# Patient Record
Sex: Male | Born: 1960
Health system: Southern US, Community
[De-identification: ages and names within clinical notes are randomized; demographics above are authoritative.]

## PROBLEM LIST (undated history)

## (undated) DIAGNOSIS — F419 Anxiety disorder, unspecified: Secondary | ICD-10-CM

## (undated) DIAGNOSIS — K219 Gastro-esophageal reflux disease without esophagitis: Secondary | ICD-10-CM

## (undated) DIAGNOSIS — I10 Essential (primary) hypertension: Secondary | ICD-10-CM

## (undated) DIAGNOSIS — F32A Depression, unspecified: Secondary | ICD-10-CM

## (undated) HISTORY — DX: Depression, unspecified: F32.A

## (undated) HISTORY — DX: Essential (primary) hypertension: I10

## (undated) HISTORY — DX: Gastro-esophageal reflux disease without esophagitis: K21.9

## (undated) HISTORY — PX: TONSILLECTOMY: SUR1361

## (undated) HISTORY — DX: Anxiety disorder, unspecified: F41.9

---

## 1994-10-14 ENCOUNTER — Encounter (INDEPENDENT_AMBULATORY_CARE_PROVIDER_SITE_OTHER): Payer: Self-pay | Admitting: *Deleted

## 2003-08-10 ENCOUNTER — Ambulatory Visit (HOSPITAL_COMMUNITY): Admission: RE | Admit: 2003-08-10 | Discharge: 2003-08-10 | Payer: Self-pay | Admitting: Internal Medicine

## 2004-02-19 ENCOUNTER — Emergency Department (HOSPITAL_COMMUNITY): Admission: EM | Admit: 2004-02-19 | Discharge: 2004-02-19 | Payer: Self-pay | Admitting: Emergency Medicine

## 2004-02-22 ENCOUNTER — Ambulatory Visit (HOSPITAL_COMMUNITY): Admission: RE | Admit: 2004-02-22 | Discharge: 2004-02-22 | Payer: Self-pay | Admitting: Internal Medicine

## 2005-12-18 ENCOUNTER — Ambulatory Visit: Payer: Self-pay | Admitting: Internal Medicine

## 2008-07-31 ENCOUNTER — Ambulatory Visit: Payer: Self-pay | Admitting: Internal Medicine

## 2008-07-31 DIAGNOSIS — K219 Gastro-esophageal reflux disease without esophagitis: Secondary | ICD-10-CM | POA: Insufficient documentation

## 2008-07-31 DIAGNOSIS — N529 Male erectile dysfunction, unspecified: Secondary | ICD-10-CM | POA: Insufficient documentation

## 2008-07-31 DIAGNOSIS — R071 Chest pain on breathing: Secondary | ICD-10-CM | POA: Insufficient documentation

## 2008-08-01 DIAGNOSIS — E785 Hyperlipidemia, unspecified: Secondary | ICD-10-CM | POA: Insufficient documentation

## 2008-09-20 ENCOUNTER — Telehealth: Payer: Self-pay | Admitting: Internal Medicine

## 2008-11-01 ENCOUNTER — Ambulatory Visit: Payer: Self-pay | Admitting: Internal Medicine

## 2008-11-01 ENCOUNTER — Telehealth: Payer: Self-pay | Admitting: Internal Medicine

## 2009-08-06 ENCOUNTER — Ambulatory Visit: Payer: Self-pay | Admitting: Internal Medicine

## 2009-08-06 DIAGNOSIS — S86819A Strain of other muscle(s) and tendon(s) at lower leg level, unspecified leg, initial encounter: Secondary | ICD-10-CM

## 2009-08-06 DIAGNOSIS — S838X9A Sprain of other specified parts of unspecified knee, initial encounter: Secondary | ICD-10-CM | POA: Insufficient documentation

## 2009-08-07 ENCOUNTER — Encounter (INDEPENDENT_AMBULATORY_CARE_PROVIDER_SITE_OTHER): Payer: Self-pay | Admitting: *Deleted

## 2009-09-13 ENCOUNTER — Ambulatory Visit: Payer: Self-pay | Admitting: Internal Medicine

## 2009-09-13 LAB — CONVERTED CEMR LAB
ALT: 18 units/L (ref 0–53)
Albumin: 4.4 g/dL (ref 3.5–5.2)
Alkaline Phosphatase: 62 units/L (ref 39–117)
BUN: 14 mg/dL (ref 6–23)
Basophils Relative: 0.2 % (ref 0.0–3.0)
Bilirubin Urine: NEGATIVE
Calcium: 10 mg/dL (ref 8.4–10.5)
Eosinophils Absolute: 0.1 10*3/uL (ref 0.0–0.7)
GFR calc non Af Amer: 84.45 mL/min (ref >60)
Glucose, Bld: 113 mg/dL — ABNORMAL HIGH (ref 70–99)
Hemoglobin, Urine: NEGATIVE
Hemoglobin: 15.1 g/dL (ref 13.0–17.0)
Ketones, ur: NEGATIVE mg/dL
Lymphocytes Relative: 25 % (ref 12.0–46.0)
MCHC: 33.7 g/dL (ref 30.0–36.0)
MCV: 94.7 fL (ref 78.0–100.0)
Neutro Abs: 4 10*3/uL (ref 1.4–7.7)
Nitrite: NEGATIVE
RBC: 4.73 M/uL (ref 4.22–5.81)
Total Protein: 7.5 g/dL (ref 6.0–8.3)
Urine Glucose: NEGATIVE mg/dL

## 2009-09-16 ENCOUNTER — Ambulatory Visit: Payer: Self-pay | Admitting: Internal Medicine

## 2009-09-16 DIAGNOSIS — G4700 Insomnia, unspecified: Secondary | ICD-10-CM | POA: Insufficient documentation

## 2009-09-16 DIAGNOSIS — R5381 Other malaise: Secondary | ICD-10-CM

## 2009-09-16 DIAGNOSIS — R5383 Other fatigue: Secondary | ICD-10-CM | POA: Insufficient documentation

## 2009-09-16 DIAGNOSIS — R0602 Shortness of breath: Secondary | ICD-10-CM | POA: Insufficient documentation

## 2009-09-16 DIAGNOSIS — F341 Dysthymic disorder: Secondary | ICD-10-CM | POA: Insufficient documentation

## 2009-09-16 DIAGNOSIS — M654 Radial styloid tenosynovitis [de Quervain]: Secondary | ICD-10-CM | POA: Insufficient documentation

## 2009-09-16 DIAGNOSIS — Z87891 Personal history of nicotine dependence: Secondary | ICD-10-CM | POA: Insufficient documentation

## 2009-09-16 DIAGNOSIS — R1013 Epigastric pain: Secondary | ICD-10-CM | POA: Insufficient documentation

## 2009-09-16 DIAGNOSIS — R197 Diarrhea, unspecified: Secondary | ICD-10-CM | POA: Insufficient documentation

## 2009-09-17 LAB — CONVERTED CEMR LAB: Vit D, 25-Hydroxy: 27 ng/mL — ABNORMAL LOW (ref 30–89)

## 2009-09-18 ENCOUNTER — Ambulatory Visit: Payer: Self-pay | Admitting: Internal Medicine

## 2009-09-18 ENCOUNTER — Telehealth (INDEPENDENT_AMBULATORY_CARE_PROVIDER_SITE_OTHER): Payer: Self-pay | Admitting: *Deleted

## 2009-10-08 ENCOUNTER — Encounter: Admission: RE | Admit: 2009-10-08 | Discharge: 2009-10-08 | Payer: Self-pay | Admitting: Internal Medicine

## 2009-10-09 ENCOUNTER — Telehealth: Payer: Self-pay | Admitting: Internal Medicine

## 2009-10-17 ENCOUNTER — Ambulatory Visit: Payer: Self-pay | Admitting: Internal Medicine

## 2009-10-21 ENCOUNTER — Encounter: Payer: Self-pay | Admitting: Internal Medicine

## 2009-11-15 ENCOUNTER — Ambulatory Visit: Payer: Self-pay | Admitting: Internal Medicine

## 2009-11-15 DIAGNOSIS — R634 Abnormal weight loss: Secondary | ICD-10-CM | POA: Insufficient documentation

## 2009-11-15 DIAGNOSIS — E291 Testicular hypofunction: Secondary | ICD-10-CM | POA: Insufficient documentation

## 2009-11-15 LAB — CONVERTED CEMR LAB
FSH: 5.9 milliintl units/mL (ref 1.4–18.1)
LH: 2.55 milliintl units/mL (ref 1.50–9.30)

## 2010-01-20 ENCOUNTER — Ambulatory Visit: Payer: Self-pay | Admitting: Internal Medicine

## 2010-01-20 DIAGNOSIS — B079 Viral wart, unspecified: Secondary | ICD-10-CM | POA: Insufficient documentation

## 2010-01-20 DIAGNOSIS — E559 Vitamin D deficiency, unspecified: Secondary | ICD-10-CM | POA: Insufficient documentation

## 2010-01-27 ENCOUNTER — Encounter: Payer: Self-pay | Admitting: Internal Medicine

## 2010-01-27 ENCOUNTER — Ambulatory Visit: Payer: Self-pay

## 2010-01-27 ENCOUNTER — Encounter (HOSPITAL_COMMUNITY): Admission: RE | Admit: 2010-01-27 | Discharge: 2010-03-11 | Payer: Self-pay | Admitting: Internal Medicine

## 2010-01-27 ENCOUNTER — Ambulatory Visit: Payer: Self-pay | Admitting: Internal Medicine

## 2010-03-17 ENCOUNTER — Ambulatory Visit: Payer: Self-pay | Admitting: Internal Medicine

## 2010-03-18 ENCOUNTER — Ambulatory Visit: Payer: Self-pay | Admitting: Internal Medicine

## 2010-03-21 LAB — CONVERTED CEMR LAB: Testosterone: 243.95 ng/dL — ABNORMAL LOW (ref 350–890)

## 2010-04-21 ENCOUNTER — Encounter: Payer: Self-pay | Admitting: Internal Medicine

## 2010-07-20 LAB — CONVERTED CEMR LAB
ALT: 19 units/L (ref 0–53)
Basophils Absolute: 0.1 10*3/uL (ref 0.0–0.1)
Basophils Relative: 0.9 % (ref 0.0–3.0)
Blood Glucose, Fingerstick: 92
CO2: 30 meq/L (ref 19–32)
Calcium: 9.9 mg/dL (ref 8.4–10.5)
Cholesterol: 238 mg/dL (ref 0–200)
Creatinine, Ser: 1 mg/dL (ref 0.4–1.5)
Direct LDL: 151.8 mg/dL
Glucose, Bld: 95 mg/dL (ref 70–99)
HCT: 48.4 % (ref 39.0–52.0)
Hemoglobin: 16.6 g/dL (ref 13.0–17.0)
Ketones, ur: NEGATIVE mg/dL
Leukocytes, UA: NEGATIVE
Lymphocytes Relative: 15.8 % (ref 12.0–46.0)
MCHC: 34.2 g/dL (ref 30.0–36.0)
Monocytes Relative: 8.1 % (ref 3.0–12.0)
Neutro Abs: 6.9 10*3/uL (ref 1.4–7.7)
RBC: 5.25 M/uL (ref 4.22–5.81)
Specific Gravity, Urine: 1.02 (ref 1.000–1.03)
Total CHOL/HDL Ratio: 7.2
Total Protein: 8.1 g/dL (ref 6.0–8.3)
Vit D, 25-Hydroxy: 57 ng/mL (ref 30–89)
Vitamin B-12: 463 pg/mL (ref 211–911)
pH: 6 (ref 5.0–8.0)

## 2010-07-22 NOTE — Assessment & Plan Note (Signed)
Summary: Cardiology Nuclear Testing  Nuclear Med Background Indications for Stress Test: Evaluation for Ischemia    History Comments: NO DOCUMENTED CAD  Symptoms: DOE, Fatigue    Nuclear Pre-Procedure Cardiac Risk Factors: History of Smoking, Lipids Caffeine/Decaff Intake: None NPO After: 10:00 PM Lungs: Clear IV 0.9% NS with Angio Cath: 20g     IV Site: (R) AC IV Started by: Stanton Kidney EMT-P Chest Size (in) 40     Height (in): 70.5 Weight (lb): 171 BMI: 24.28  Nuclear Med Study 1 or 2 day study:  1 day     Stress Test Type:  Stress Reading MD:  Dietrich Pates, MD     Referring MD:  Sonda Primes, MD Resting Radionuclide:  Technetium 13m Tetrofosmin     Resting Radionuclide Dose:  10.8 mCi  Stress Radionuclide:  Technetium 74m Tetrofosmin     Stress Radionuclide Dose:  33.0 mCi   Stress Protocol Exercise Time (min):  15:00 min     Max HR:  157 bpm     Predicted Max HR:  171 bpm  Max Systolic BP: 237 mm Hg     Percent Max HR:  91.81 %     METS: 17.2 Rate Pressure Product:  [address]    Stress Test Technologist:  Rea College CMA-N     Nuclear Technologist:  Burna Mortimer Deal RT-N  Rest Procedure  Myocardial perfusion imaging was performed at rest 45 minutes following the intravenous administration of Myoview Technetium 76m Tetrofosmin.  Stress Procedure  The patient exercised for fifteen minutes.  The patient stopped due to fatigue and denied any chest pain.  There were no significant ST-T wave changes, only rare PVC's.  He did have a hypertensive response to exercise.  BP immediately post exercise was 237/117.  Myoview was injected at peak exercise and myocardial perfusion imaging was performed after a brief delay.  QPS Raw Data Images:  Soft tissue (diaphragm, bowel activity) underlie heart. Stress Images:  There is normal uptake in all areas. Rest Images:  Normal homogeneous uptake in all areas of the myocardium. Subtraction (SDS):  No evidence of ischemia. Transient Ischemic  Dilatation:  1.01  (Normal <1.22)  Lung/Heart Ratio:  .31  (Normal <0.45)  Quantitative Gated Spect Images QGS EDV:  130 ml QGS ESV:  54 ml QGS EF:  59 %   Overall Impression  Exercise Capacity: Excellent exercise capacity. BP Response: Hypertensive blood pressure response. Clinical Symptoms: No chest pain ECG Impression: No significant ST segment change suggestive of ischemia. Overall Impression: Normal stress nuclear study.

## 2010-07-22 NOTE — Letter (Signed)
Summary: [facility] Instructions  [facility]  [address] [place], [place] [address]   Phone: [phone]  Fax: [phone]       David Hinton    25-Mar-1961    MRN: [id number]        Procedure Day /Date:THURSDAY, 10/17/09     Arrival Time:2:30 PM     Procedure Time:3:30 PM     Location of Procedure:                    X  [facility] (4th Floor)                        PREPARATION FOR COLONOSCOPY WITH MOVIPREP   Starting 5 days prior to your procedure 4/23/11do not eat nuts, seeds, popcorn, corn, beans, peas,  salads, or any raw vegetables.  Do not take any fiber supplements (e.g. Metamucil, Citrucel, and Benefiber).  THE DAY BEFORE YOUR PROCEDURE         DATE: 10/16/09 DAY: WEDNESDAY  1.  Drink clear liquids the entire day-NO SOLID FOOD  2.  Do not drink anything colored red or purple.  Avoid juices with pulp.  No orange juice.  3.  Drink at least 64 oz. (8 glasses) of fluid/clear liquids during the day to prevent dehydration and help the prep work efficiently.  CLEAR LIQUIDS INCLUDE: Water Jello Ice Popsicles Tea (sugar ok, no milk/cream) Powdered fruit flavored drinks Coffee (sugar ok, no milk/cream) Gatorade Juice: apple, white grape, white cranberry  Lemonade Clear bullion, consomm, broth Carbonated beverages (any kind) Strained chicken noodle soup Hard Candy                             4.  In the morning, mix first dose of MoviPrep solution:    Empty 1 Pouch A and 1 Pouch B into the disposable container    Add lukewarm drinking water to the top line of the container. Mix to dissolve    Refrigerate (mixed solution should be used within 24 hrs)  5.  Begin drinking the prep at 5:00 p.m. The MoviPrep container is divided by 4 marks.   Every 15 minutes drink the solution down to the next mark (approximately 8 oz) until the full liter is complete.   6.  Follow completed prep with 16 oz of clear liquid of your choice (Nothing red  or purple).  Continue to drink clear liquids until bedtime.  7.  Before going to bed, mix second dose of MoviPrep solution:    Empty 1 Pouch A and 1 Pouch B into the disposable container    Add lukewarm drinking water to the top line of the container. Mix to dissolve    Refrigerate  THE DAY OF YOUR PROCEDURE      DATE: 10/17/09 DAY: THURSDAY  Beginning at 9:30 a.m. (5 hours before procedure):         1. Every 15 minutes, drink the solution down to the next mark (approx 8 oz) until the full liter is complete.  2. Follow completed prep with 16 oz. of clear liquid of your choice.    3. You may drink clear liquids until 12:30 PM (2 HOURS BEFORE PROCEDURE).   MEDICATION INSTRUCTIONS  Unless otherwise instructed, you should take regular prescription medications with a small sip of water   as early as possible the morning of your procedure.  OTHER INSTRUCTIONS  You will need a responsible adult at least 50 years of age to accompany you and drive you home.   This person must remain in the waiting room during your procedure.  Wear loose fitting clothing that is easily removed.  Leave jewelry and other valuables at home.  However, you may wish to bring a book to read or  an iPod/MP3 player to listen to music as you wait for your procedure to start.  Remove all body piercing jewelry and leave at home.  Total time from sign-in until discharge is approximately 2-3 hours.  You should go home directly after your procedure and rest.  You can resume normal activities the  day after your procedure.  The day of your procedure you should not:   Drive   Make legal decisions   Operate machinery   Drink alcohol   Return to work  You will receive specific instructions about eating, activities and medications before you leave.    The above instructions have been reviewed and explained to me by   _______________________    I fully understand and can verbalize these  instructions _____________________________ Date _________

## 2010-07-22 NOTE — Letter (Signed)
Summary: Patient Notice- Colon Biospy Results  [facility]  [address] [place], [place] [address]   Phone: [phone]  Fax: [phone]        Oct 21, 2009 MRN: [id number]    David Hinton [address] [place], [place]  [address]    Dear [name],  The biopsies taken during the colonoscopy both your ileum, distal small bowel, revealed inflammation or ileitis. This is not cancer. This can be seen in inflammatory conditions such as Crohn's.  The biopsies of your colon were normal. There was no evidence of microscopic colitis.  The biopsies of your proximal small bowel, and duodenum, taken during the upper endoscopy were normal. No evidence of sprue.  Additional information/recommendations:   __Please call [phone] to schedule a return visit to review      your condition, as already recommended.  __Continue with the treatment plan as outlined on the day of your      exam on your procedure reports.   Please call us if you are having persistent problems or have questions about your condition that have not been fully answered at this time.  Sincerely,  Hilarie Fredrickson MD   This letter has been electronically signed by your physician.  Appended Document: Patient Notice- Colon Biospy Results letter mailed 5.3.11

## 2010-07-22 NOTE — Procedures (Signed)
Summary: Panendoscopy/[facility]  Panendoscopy/[facility]   Imported By: Sherian Rein 09/20/2009 07:28:49  _____________________________________________________________________  External Attachment:    Type:   Image     Comment:   External Document

## 2010-07-22 NOTE — Assessment & Plan Note (Signed)
Summary: 6 WK ROV /NWS   Vital Signs:  Patient profile:   50 year old male Height:      71 inches Weight:      174 pounds BMI:     24.36 Temp:     98.9 degrees F oral Pulse rate:   88 / minute Pulse rhythm:   regular Resp:     16 per minute BP sitting:   122 / 90  (left arm) Cuff size:   regular  Vitals Entered By: Lanier Prude, CMA(AAMA) (January 20, 2010 1:33 PM)  Procedure Note  Wart Removal: The patient complains of irritation. Indication: warts  Procedure # 1: cryotherapy    Region: anterior    Location: L neck base    # lesions removed: 2    Technique: liquid N2    Comment: Tolerated well. Complicatons - none. Verbal consent.   CC: 6 wk f/u Is Patient Diabetic? No Comments pt is not taking Cialis, VIT D, Flexeril, Mobic, Wellbutrin or DHEA   Primary Care Provider:  Georgina Quint Grace Haggart MD  CC:  6 wk f/u.  History of Present Illness: The patient presents for a follow up of back pain, anxiety, depression. Stressed with mom's illness C/o skin lesions on L neck base  Current Medications (verified): 1)  Omeprazole 20 Mg Cpdr (Omeprazole) .... One By Mouth Daily 2)  Vitamin D3 1000 Unit  Tabs (Cholecalciferol) .Marland Kitchen.. 1 By Mouth Daily 3)  Cialis 10 Mg Tabs (Tadalafil) .Marland Kitchen.. 1 By Mouth Q1-3 D Prn 4)  Vitamin D 28413 Unit Caps (Ergocalciferol) .Marland Kitchen.. 1 Once Wk X 6 Wks Then Take Vit D 1000 International Units Otc Daily 5)  Flexeril 10 Mg Tabs (Cyclobenzaprine Hcl) .... 1/2-1 Tab By Mouth Every 8 Hours As Needed For Muscle Relaxant 6)  Mobic 15 Mg Tabs (Meloxicam) .Marland Kitchen.. 1 By Mouth Once Daily X7 Days, Then As Needed 7)  Wellbutrin Sr 150 Mg Xr12h-Tab (Bupropion Hcl) .Marland Kitchen.. 1 By Mouth Q Am and Q Lunch (Two Times A Day) 8)  Zolpidem Tartrate 10 Mg Tabs (Zolpidem Tartrate) .... 1/2-1 Tab At Bedtime As Needed Insomnia 9)  Dhea 25 Mg Tabs (Prasterone (Dhea)) .Marland Kitchen.. 1 By Mouth Qd  Allergies (verified): 1)  Viagra (Sildenafil Citrate) 2)  Levitra (Vardenafil Hcl)  Past  History:  Past Medical History: Last updated: 07/31/2008 GERD, erosive Dr [facility] ED Hyperlipidemia, mild  Past Surgical History: Last updated: 09/18/2009 Unremarkable  Family History: Last updated: 09/18/2009 No CA, no CAD No FH of Colon Cancer: Family History of Crohn's:Mother   Social History: Last updated: 09/18/2009 Occupation: Horse Veterinarian Single Former Smoker Alcohol use-yes Regular exercise-yes  Review of Systems       The patient complains of depression.  The patient denies fever, dyspnea on exertion, abdominal pain, and difficulty walking.         Stressed  Physical Exam  General:  Well-developed,well-nourished,in no acute distress; alert,appropriate and cooperative throughout examination Nose:  External nasal examination shows no deformity or inflammation. Nasal mucosa are pink and moist without lesions or exudates. Mouth:  Oral mucosa and oropharynx without lesions or exudates.  Teeth in good repair. Neck:  No deformities, masses, or tenderness noted. Lungs:  Normal respiratory effort, chest expands symmetrically. Lungs are clear to auscultation, no crackles or wheezes. Heart:  Normal rate and regular rhythm. S1 and S2 normal without gallop, murmur, click, rub or other extra sounds. Abdomen:  Bowel sounds positive,abdomen soft and non-tender without masses, organomegaly or hernias noted. Msk:  No deformity or scoliosis noted of thoracic or lumbar spine.   Extremities:  No clubbing, cyanosis, edema, or deformity noted with normal full range of motion of all joints.   Neurologic:  No cranial nerve deficits noted. Station and gait are normal. Plantar reflexes are down-going bilaterally. DTRs are symmetrical throughout. Sensory, motor and coordinative functions appear intact. Skin:  Two wart-like lesions at L neck base Psych:  Cognition and judgment appear intact. Alert and cooperative with normal attention span and concentration. No apparent delusions,  illusions, hallucinationsOriented X3, normally interactive, good eye contact, not suicidal, and slightly anxious.  not homicidal.     Impression & Recommendations:  Problem # 1:  DEPRESSION/ANXIETY (ICD-300.4) Assessment Unchanged Discussed. He has not started Wellbutrin yet. I asked him to start  Problem # 2:  HYPOGONADISM (ICD-257.2) Assessment: New He has not started DHEA. We discussed replacement options again. Info provided. We will geet an Endocr consult. Orders: Endocrinology Referral (Endocrine) Dr Lucianne Muss. The labs were reviewed with the patient. LH, FSH - nl.  Problem # 3:  FATIGUE (ICD-780.79) Assessment: Unchanged As per #1,2 Orders: Endocrinology Referral (Endocrine)  Problem # 4:  ERECTILE DYSFUNCTION (ZOX-096.04) Assessment: Unchanged  His updated medication list for this problem includes:    Cialis 10 Mg Tabs (Tadalafil) .Marland Kitchen... 1 by mouth q1-3 d prn  Problem # 5:  ABDOMINAL PAIN, EPIGASTRIC (ICD-789.06) Assessment: Improved  Orders: Cardiolite (Cardiolite)  Problem # 6:  DYSPNEA (ICD-786.05) Assessment: Unchanged  Orders: Cardiolite (Cardiolite)  Problem # 7:  VITAMIN D DEFICIENCY (ICD-268.9) Assessment: New He has not started Rx yet. Risks of noncompliance with treatment discussed. Compliance encouraged.   Complete Medication List: 1)  Vitamin D3 1000 Unit Tabs (Cholecalciferol) .Marland Kitchen.. 1 by mouth daily 2)  Cialis 10 Mg Tabs (Tadalafil) .Marland Kitchen.. 1 by mouth q1-3 d prn 3)  Vitamin D 54098 Unit Caps (Ergocalciferol) .Marland Kitchen.. 1 once wk x 6 wks then take vit d 1000 international units otc daily 4)  Flexeril 10 Mg Tabs (Cyclobenzaprine hcl) .... 1/2-1 tab by mouth every 8 hours as needed for muscle relaxant 5)  Mobic 15 Mg Tabs (Meloxicam) .Marland Kitchen.. 1 by mouth once daily x7 days, then as needed 6)  Wellbutrin Sr 150 Mg Xr12h-tab (Bupropion hcl) .Marland Kitchen.. 1 by mouth q am and q lunch (two times a day) 7)  Zolpidem Tartrate 10 Mg Tabs (Zolpidem tartrate) .... 1/2-1 tab at bedtime  as needed insomnia 8)  Dhea 25 Mg Tabs (Prasterone (dhea)) .Marland Kitchen.. 1 by mouth qd 9)  Omeprazole 40 Mg Cpdr (Omeprazole) .Marland Kitchen.. 1 by mouth qam for indigestion 10)  Penlac 8 % Soln (Ciclopirox) .... Use once daily on affected nail(s). once a week remove the build-up with an alcohol swab  Other Orders: Wart Destruct <14 ([address])  Patient Instructions: 1)  Please schedule a follow-up appointment in 6  weeks. 2)  DHEA and testost 995.20 3)  Vit D 268.9  Prescriptions: PENLAC 8 % SOLN (CICLOPIROX) Use once daily on affected nail(s). Once a week remove the build-up with an alcohol swab  #1 x 2   Entered and Authorized by:   Tresa Garter MD   Signed by:   Tresa Garter MD on 01/20/2010   Method used:   Print then Give to Patient   RxID:   [id number] OMEPRAZOLE 40 MG CPDR (OMEPRAZOLE) 1 by mouth qam for indigestion  #90 x 3   Entered and Authorized by:   Tresa Garter MD   Signed by:   Macarthur Critchley  V Hilde Churchman MD on 01/20/2010   Method used:   Print then Give to Patient   RxID:   [id number]

## 2010-07-22 NOTE — Procedures (Signed)
Summary: Upper Endoscopy  Patient: [name] Note: All result statuses are Final unless otherwise noted.  Tests: (1) Upper Endoscopy (EGD)   EGD Upper Endoscopy       DONE     [facility]     [address]     [place], [place]  [address]           ENDOSCOPY PROCEDURE REPORT           PATIENT:  David Hinton, David Hinton  MR#:  [id number]     BIRTHDATE:  [birth year], 48 yrs. old  GENDER:  male           ENDOSCOPIST:  Wilhemina Bonito. Eda Keys, MD     Referred by:  Office           PROCEDURE DATE:  10/17/2009     PROCEDURE:  EGD with biopsies     ASA CLASS:  Class II     INDICATIONS:  abdominal pain, GERD, diarrhea           MEDICATIONS:   There was residual sedation effect present from     prior procedure.     TOPICAL ANESTHETIC:  Exactacain Spray           DESCRIPTION OF PROCEDURE:   After the risks benefits and     alternatives of the procedure were thoroughly explained, informed     consent was obtained.  The LB [id number]-[id number] endoscope was     introduced through the mouth and advanced to the second portion of     the duodenum, without limitations.  The instrument was slowly     withdrawn as the mucosa was fully examined.     <<PROCEDUREIMAGES>>           A benign 16mm ring-like stricture was found in the distal     esophagus.  The upper, middle, and distal third of the esophagus     were carefully inspected and no additional abnormalities were     noted. The z-line was well seen at the GEJ. The endoscope was     pushed into the fundus which was normal including a retroflexed     view. The antrum,gastric body, first and second part of the     duodenum were unremarkable. Biopsies of the postbulbar duodenum     were taken to r/o sprue.   Retroflexed views revealed a hiatal     hernia.    The scope was then withdrawn from the patient and the     procedure completed.           COMPLICATIONS:  None           ENDOSCOPIC IMPRESSION:     1) Benign Stricture in the distal  esophagus     2) Hiatal hernia     2) Normal EGD otherwise     3) GERD           RECOMMENDATIONS:     1) continue PPI (omeprazole daily)     2) Anti-reflux regimen to be followed     3) OP follow-up in a few weeks to review findings and biosy     results           ______________________________     Wilhemina Bonito. Eda Keys, MD           CC:  Linda Hedges. Plotnikov, MD, The Patient           n.     eSIGNED:  Wilhemina Bonito. Eda Keys at 10/17/2009 04:54 PM           Lynnae January, [id number]  Note: An exclamation mark (!) indicates a result that was not dispersed into the flowsheet. Document Creation Date: 10/17/2009 4:55 PM _______________________________________________________________________  (1) Order result status: Final Collection or observation date-time: 10/17/2009 16:45 Requested date-time:  Receipt date-time:  Reported date-time:  Referring Physician:   Ordering Physician: Fransico Setters [phone]) Specimen Source:  Source: Launa Grill Order Number: [id number] Lab site:
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
## 2010-07-22 NOTE — Assessment & Plan Note (Signed)
Summary: 2 mth fu  stc   Vital Signs:  Patient profile:   50 year old male Height:      71 inches Weight:      169 pounds BMI:     23.66 O2 Sat:      98 % on Room air Temp:     97.0 degrees F oral Pulse rate:   58 / minute Pulse rhythm:   regular BP sitting:   130 / 88  (left arm) Cuff size:   large  Vitals Entered By: Rock Nephew CMA (Nov 15, 2009 10:21 AM)  O2 Flow:  Room air  Primary Care Provider:  Tresa Garter MD   History of Present Illness: The patient presents for a follow up of back pain, anxiety, depression and headaches.   Allergies: 1)  Viagra (Sildenafil Citrate) 2)  Levitra (Vardenafil Hcl)  Physical Exam  General:  Well-developed,well-nourished,in no acute distress; alert,appropriate and cooperative throughout examination Nose:  External nasal examination shows no deformity or inflammation. Nasal mucosa are pink and moist without lesions or exudates. Mouth:  Oral mucosa and oropharynx without lesions or exudates.  Teeth in good repair. Lungs:  Normal respiratory effort, chest expands symmetrically. Lungs are clear to auscultation, no crackles or wheezes. Heart:  Normal rate and regular rhythm. S1 and S2 normal without gallop, murmur, click, rub or other extra sounds. Abdomen:  Bowel sounds positive,abdomen soft and non-tender without masses, organomegaly or hernias noted. Msk:  No deformity or scoliosis noted of thoracic or lumbar spine.   Extremities:  No clubbing, cyanosis, edema, or deformity noted with normal full range of motion of all joints.   Neurologic:  No cranial nerve deficits noted. Station and gait are normal. Plantar reflexes are down-going bilaterally. DTRs are symmetrical throughout. Sensory, motor and coordinative functions appear intact. Skin:  Intact without suspicious lesions or rashes Psych:  Cognition and judgment appear intact. Alert and cooperative with normal attention span and concentration. No apparent delusions,  illusions, hallucinationsOriented X3, normally interactive, good eye contact, not suicidal, and slightly anxious.  not homicidal.     Impression & Recommendations:  Problem # 1:  WEIGHT LOSS (ICD-783.21) Assessment Comment Only  Orders: T-Dehydroepiandrosterone (DHEA) (04540-98119) TLB-Luteinizing Hormone (LH) (83002-LH) TLB-FSH (Follicle Stimulating Hormone) (83001-FSH)  Problem # 2:  HYPOGONADISM (ICD-257.2) Assessment: Comment Only  Treat stress/depr. Will start Androgel if DHEA did not help Try DHEA Get LH, FSH, DHEA  Orders: T-Dehydroepiandrosterone (DHEA) (14782-95621) TLB-Luteinizing Hormone (LH) (83002-LH) TLB-FSH (Follicle Stimulating Hormone) (83001-FSH)  Problem # 3:  FATIGUE (ICD-780.79) Assessment: Comment Only  Problem # 4:  DEPRESSION/ANXIETY (ICD-300.4) Assessment: Comment Only Start Wellbutrin  Risks of noncompliance with treatment discussed. Compliance encouraged.   Problem # 5:  DYSPNEA (ICD-786.05) ? etiol. Assessment: Unchanged CL is pending   Complete Medication List: 1)  Omeprazole 20 Mg Cpdr (Omeprazole) .... One by mouth daily 2)  Vitamin D3 1000 Unit Tabs (Cholecalciferol) .Marland Kitchen.. 1 by mouth daily 3)  Cialis 10 Mg Tabs (Tadalafil) .Marland Kitchen.. 1 by mouth q1-3 d prn 4)  Vitamin D 30865 Unit Caps (Ergocalciferol) .Marland Kitchen.. 1 once wk x 6 wks then take vit d 1000 international units otc daily 5)  Flexeril 10 Mg Tabs (Cyclobenzaprine hcl) .... 1/2-1 tab by mouth every 8 hours as needed for muscle relaxant 6)  Mobic 15 Mg Tabs (Meloxicam) .Marland Kitchen.. 1 by mouth once daily x7 days, then as needed 7)  Wellbutrin Sr 150 Mg Xr12h-tab (Bupropion hcl) .Marland Kitchen.. 1 by mouth q am and q lunch (  two times a day) 8)  Zolpidem Tartrate 10 Mg Tabs (Zolpidem tartrate) .... 1/2-1 tab at bedtime as needed insomnia 9)  Dhea 25 Mg Tabs (Prasterone (dhea)) .Marland Kitchen.. 1 by mouth qd  Patient Instructions: 1)  Start Wellbutrin and DHEA 2)  Please schedule a follow-up appointment in 6 weeks with  testosterone check prior 780.79. Prescriptions: DHEA 25 MG TABS (PRASTERONE (DHEA)) 1 by mouth qd  #30 x 6   Entered and Authorized by:   Tresa Garter MD   Signed by:   Tresa Garter MD on 11/15/2009   Method used:   Print then Give to Patient   RxID:   [id number]

## 2010-07-22 NOTE — Progress Notes (Signed)
Summary: Omeprazole refill  Phone Note Refill Request Message from:  Fax from Pharmacy on October 09, 2009 8:26 AM  Refills Requested: Medication #1:  OMEPRAZOLE 20 MG CPDR one by mouth daily Initial call taken by: Lucious Groves,  October 09, 2009 8:26 AM    Prescriptions: OMEPRAZOLE 20 MG CPDR (OMEPRAZOLE) one by mouth daily  #30 x 12   Entered by:   Lucious Groves   Authorized by:   Tresa Garter MD   Signed by:   Lucious Groves on 10/09/2009   Method used:   Electronically to        [facility]. [phone]* (retail)       [address]       [place], [place]  [address]       Ph: [phone]       Fax: [phone]   RxID:   [id number]

## 2010-07-22 NOTE — Assessment & Plan Note (Signed)
Summary: GERD/ diarrhea / pain    History of Present Illness Visit Type: consult  Primary GI MD: Yancey Flemings MD Primary Provider: Tresa Garter MD Requesting Provider: Rene Paci, MD  Chief Complaint: GERD, Nausea and Vomiting  History of Present Illness:   50 year old white male with anxiety, gastroesophageal reflux disease complicated by erosive esophagitis, and probable irritable bowel syndrome. He underwent upper endoscopy in 1996 for GI bleeding and was found to have severe erosive esophagitis. He was seen in this office in May of 2002 for reflux disease, lower abdominal complaints, and Hemoccult-positive stool. It was recommended that he undergo colonoscopy and upper endoscopy. He failed to follow through. He has not been seen since. He presents today with a multitude of complaints. He reports significant epigastric discomfort and heartburn if he does not take omeprazole 20 mg daily. On medication symptoms seem fairly well controlled. He does have occasional nausea with vomiting. No GI bleeding. He's had some weight loss. He continues with postprandial urgency associated with loose stools. He tells that his mother was diagnosed with Crohn's disease recently. He seems anxious and worried. He also exhibits peculiar behavior. Specifically, he reports eating meals out of dumpsters behind grocery stores. He tells me that I need to "vet him".   GI Review of Systems    Reports abdominal pain, acid reflux, bloating, heartburn, nausea, and  vomiting.     Location of  Abdominal pain: upper abdomen.    Denies belching, chest pain, dysphagia with liquids, dysphagia with solids, loss of appetite, vomiting blood, weight loss, and  weight gain.      Reports diarrhea.     Denies anal fissure, black tarry stools, change in bowel habit, constipation, diverticulosis, fecal incontinence, heme positive stool, hemorrhoids, irritable bowel syndrome, jaundice, light color stool, liver problems, rectal  bleeding, and  rectal pain.    Current Medications (verified): 1)  Omeprazole 20 Mg Cpdr (Omeprazole) .... One By Mouth Daily 2)  Vitamin D3 1000 Unit  Tabs (Cholecalciferol) .Marland Kitchen.. 1 By Mouth Daily 3)  Cialis 10 Mg Tabs (Tadalafil) .Marland Kitchen.. 1 By Mouth Q1-3 D Prn 4)  Vitamin D 16109 Unit Caps (Ergocalciferol) .Marland Kitchen.. 1 Once Wk X 6 Wks Then Take Vit D 1000 International Units Otc Daily 5)  Flexeril 10 Mg Tabs (Cyclobenzaprine Hcl) .... 1/2-1 Tab By Mouth Every 8 Hours As Needed For Muscle Relaxant 6)  Mobic 15 Mg Tabs (Meloxicam) .Marland Kitchen.. 1 By Mouth Once Daily X7 Days, Then As Needed 7)  Wellbutrin Sr 150 Mg Xr12h-Tab (Bupropion Hcl) .Marland Kitchen.. 1 By Mouth Q Am and Q Lunch (Two Times A Day) 8)  Zolpidem Tartrate 10 Mg Tabs (Zolpidem Tartrate) .... 1/2-1 Tab At Bedtime As Needed Insomnia  Allergies (verified): 1)  Viagra (Sildenafil Citrate) 2)  Levitra (Vardenafil Hcl)  Past History:  Past Medical History: Reviewed history from 07/31/2008 and no changes required. GERD, erosive Dr [facility] Hyperlipidemia, mild  Past Surgical History: Unremarkable  Family History: No CA, no CAD No FH of Colon Cancer: Family History of Crohn's:Mother   Social History: Reviewed history from 09/16/2009 and no changes required. 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  The patient denies allergy/sinus, anemia, anxiety-new, arthritis/joint pain, blood in urine, breast changes/lumps, confusion, cough, coughing up blood, fainting, fever, hearing problems, heart murmur, heart rhythm changes, itching, muscle  pains/cramps, night sweats, nosebleeds, shortness of breath, skin rash, sore throat, swelling of feet/legs, swollen lymph glands, thirst - excessive, urination - excessive, urination changes/pain, urine leakage, and  voice change.    Vital Signs:  Patient profile:   50 year old male Height:      71 inches Weight:      176 pounds BMI:     24.64 BSA:     2.00 Pulse rate:   80 / minute Pulse rhythm:   regular BP sitting:   152 / 102  (left arm) Cuff size:   regular  Vitals Entered By: Ok Anis CMA (September 18, 2009 2:00 PM)  Physical Exam  General:  Well developed, well nourished, no acute distress. Head:  Normocephalic and atraumatic. Eyes:  PERRLA, no icterus. Ears:  Normal auditory acuity. Nose:  No deformity, discharge,  or lesions. Mouth:  No deformity or lesions, dentition normal. Neck:  Supple; no masses or thyromegaly. Lungs:  Clear throughout to auscultation. Heart:  Regular rate and rhythm; no murmurs, rubs,  or bruits. Abdomen:  Soft, nontender and nondistended. No masses, hepatosplenomegaly or hernias noted. Normal bowel sounds. Rectal:  deferred until colonoscopy Prostate:  deferred until colonoscopy Msk:  Symmetrical with no gross deformities. Normal posture. Pulses:  Normal pulses noted. Extremities:  No clubbing, cyanosis, edema or deformities noted. Neurologic:  Alert and  oriented x4;  grossly normal neurologically. Skin:  Intact without significant lesions or rashes. Cervical Nodes:  No significant cervical adenopathy.no supraclavicular adenopathy Psych:  Alert and cooperative. Normal mood. Somewhat odd affect   Impression & Recommendations:  Problem # 1:  GERD (ICD-530.81) GERD with a history of severe erosive esophagitis. He seems to have intermittent problems with nausea and vomiting (787.01) and epigastric discomfort (789.06) when off PPI therapy. Cannot rule out ulcer disease.  Plan: #1. Omeprazole 20 mg daily #2. Reflux precautions #3. Discussion today on reflux disease  Problem # 2:  DIARRHEA, CHRONIC (ICD-787.91) chronic postprandial loose stools with urgency. Most consistent with irritable bowel. He has had this for years. Mother with inflammatory bowel  disease. Patient near age 45.  Plan #1. Colonoscopy to evaluate diarrhea and provide colorectal neoplasia screening. The nature of the procedure as well as risks, benefits, and alternatives have been reviewed. He understood and agreed to proceed. #2. Movi prep prescribed. The patient instructed on it use  Patient Instructions: 1)  Colon/Endo [facility] 10/17/09 2:30 pm arrive at 1:30 pm 2)  Movi prep instructions given to patient. 3)  Movi prep Rx. sent to your pharmacy for pick up. 4)  Colonoscopy and Flexible Sigmoidoscopy brochure given.  5)  Upper Endoscopy brochure given.  6)  Copy Sent to:  Dr. Trinna Post Plotnikov 7)  The medication list was reviewed and reconciled.  All changed / newly prescribed medications were explained.  A complete medication list was provided to the patient / caregiver. 8)  printed and given to patient. Milford Cage [facility]  September 18, 2009 2:54 PM 9)  Copy: Dr. Sonda Primes Prescriptions: MOVIPREP 100 GM  SOLR (PEG-KCL-NACL-NASULF-NA ASC-C) As per prep instructions.  #1 x 0   Entered by:   Milford Cage NCMA   Authorized by:   Hilarie Fredrickson MD   Signed by:   Milford Cage NCMA on 09/18/2009   Method used:   Electronically to        [facility]. [phone]* (retail)       [address]       [place]  [place], [place]  [address]       Ph: [phone]       Fax: [phone]   RxID:   [id number]   Appended Document: GERD/ diarrhea / pain ADDENDUM. Review of outside laboratories shows normal CBC, comprehensive metabolic panel, and TSH from 2 weeks ago

## 2010-07-22 NOTE — Procedures (Signed)
Summary: Colonoscopy  Patient: David Hinton Note: All result statuses are Final unless otherwise noted.  Tests: (1) Colonoscopy (COL)   COL Colonoscopy           DONE     [facility]     [address]     [place], [place]  [address]           COLONOSCOPY PROCEDURE REPORT           PATIENT:  David Hinton, David Hinton  MR#:  [id number]     BIRTHDATE:  [birth year], 48 yrs. old  GENDER:  male     ENDOSCOPIST:  Wilhemina Bonito. Eda Keys, MD     REF. BY:  Office     PROCEDURE DATE:  10/17/2009     PROCEDURE:  Colonoscopy with biopsies     ASA CLASS:  Class II     INDICATIONS:  unexplained diarrhea ; FAMILY HX IBD     MEDICATIONS:   Fentanyl 75 mcg IV, Versed 8 mg IV, Benadryl 25 mg     IV           DESCRIPTION OF PROCEDURE:   After the risks benefits and     alternatives of the procedure were thoroughly explained, informed     consent was obtained.  Digital rectal exam was performed and     revealed no abnormalities.   The LB [id number] endoscope     was introduced through the anus and advanced to the cecum, which     was identified by both the appendix and ileocecal valve, without     limitations.Time to cecum = 2:03 min. The quality of the prep was     excellent, using MoviPrep.  The instrument was then slowly     withdrawn (time = 13:10 min) as the colon was fully examined.     <<PROCEDUREIMAGES>>           FINDINGS:  A normal appearing cecum, ileocecal valve, and     appendiceal orifice were identified. The ascending, hepatic     flexure, transverse, splenic flexure, descending, sigmoid colon,     and rectum appeared unremarkable.Random colon bx taken to r/o     microscopic colitis.  There were inflammatory changes in the     terminal ileum as manifested by nonspecific punctate ulcers.     Retroflexed views in the rectum revealed no abnormalities.    The     scope was then withdrawn from the patient and the procedure     completed.           COMPLICATIONS:  None  ENDOSCOPIC IMPRESSION:     1) Normal colon - s/p biopsies     2) Ileitis in the terminal ileum - s/p biopsies     3) No polyps or  cancer           RECOMMENDATIONS:     1) Await biopsy results     2) Continue current colorectal screening recommendations for     "routine risk" patients with a repeat colonoscopy in 10 years.     3) EGD today           _____________________________     Wilhemina Bonito. Eda Keys, MD           CC:  Linda Hedges. Plotnikov, MD; The Patient           n.     eSIGNED:   Wilhemina Bonito. Eda Keys at 10/17/2009 04:39 PM  David Hinton, David Hinton, [id number]  Note: An exclamation mark (!) indicates a result that was not dispersed into the flowsheet. Document Creation Date: 10/17/2009 4:40 PM _______________________________________________________________________  (1) Order result status: Final Collection or observation date-time: 10/17/2009 16:25 Requested date-time:  Receipt date-time:  Reported date-time:  Referring Physician:   Ordering Physician: Fransico Setters [id number]) Specimen Source:  Source: Launa Grill Order Number: [id number] Lab site:   Appended Document: Colonoscopy recall in 10 yrs/07-2019/aw     Procedures Next Due Date:    Colonoscopy: 09/2019

## 2010-07-22 NOTE — Assessment & Plan Note (Signed)
Summary: 6 WK ROV /NWS  #   Vital Signs:  Patient profile:   50 year old male Height:      70.5 inches Weight:      172 pounds BMI:     24.42 Temp:     98.4 degrees F oral Pulse rate:   80 / minute Pulse rhythm:   regular Resp:     16 per minute BP sitting:   130 / 88  (left arm) Cuff size:   regular  Vitals Entered By: Lanier Prude, CMA(AAMA) (March 18, 2010 2:09 PM) CC: 6 wk f/u Is Patient Diabetic? No   Primary Care Provider:  Tresa Garter MD  CC:  6 wk f/u.  History of Present Illness: The patient presents for a follow up of  anxiety, depression and GERD   Current Medications (verified): 1)  Vitamin D3 1000 Unit  Tabs (Cholecalciferol) .Marland Kitchen.. 1 By Mouth Daily 2)  Cialis 10 Mg Tabs (Tadalafil) .Marland Kitchen.. 1 By Mouth Q1-3 D Prn 3)  Vitamin D 54098 Unit Caps (Ergocalciferol) .Marland Kitchen.. 1 Once Wk X 6 Wks Then Take Vit D 1000 International Units Otc Daily 4)  Flexeril 10 Mg Tabs (Cyclobenzaprine Hcl) .... 1/2-1 Tab By Mouth Every 8 Hours As Needed For Muscle Relaxant 5)  Mobic 15 Mg Tabs (Meloxicam) .Marland Kitchen.. 1 By Mouth Once Daily X7 Days, Then As Needed 6)  Wellbutrin Sr 150 Mg Xr12h-Tab (Bupropion Hcl) .Marland Kitchen.. 1 By Mouth Q Am and Q Lunch (Two Times A Day) 7)  Zolpidem Tartrate 10 Mg Tabs (Zolpidem Tartrate) .... 1/2-1 Tab At Bedtime As Needed Insomnia 8)  Dhea 25 Mg Tabs (Prasterone (Dhea)) .Marland Kitchen.. 1 By Mouth Qd 9)  Omeprazole 40 Mg Cpdr (Omeprazole) .Marland Kitchen.. 1 By Mouth Qam For Indigestion 10)  Penlac 8 % Soln (Ciclopirox) .... Use Once Daily On Affected Nail(S). 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    Physical Exam  General:  Well-developed,well-nourished,in no acute distress; alert,appropriate and cooperative throughout examination Mouth:  Oral mucosa and oropharynx without lesions or exudates.  Teeth in good repair. Lungs:  Normal respiratory effort, chest expands symmetrically. Lungs are clear to auscultation, no crackles or wheezes. Heart:  Normal rate and regular rhythm. S1 and S2 normal without gallop, murmur, click, rub or other extra sounds. Abdomen:  Bowel sounds positive,abdomen soft and non-tender without masses, organomegaly or hernias noted. Msk:  No deformity or scoliosis noted of thoracic or lumbar spine.   Neurologic:  No cranial nerve deficits noted. Station and gait are normal. Plantar reflexes are down-going bilaterally. DTRs are symmetrical throughout. Sensory, motor and coordinative functions appear intact. Skin:  Two wart-like lesions at L neck base Psych:  Cognition and judgment appear intact. Alert and cooperative with normal attention span and concentration. No apparent delusions, illusions, hallucinationsOriented X3, normally interactive, good eye contact, not suicidal, and slightly anxious.  not homicidal.     Impression & Recommendations:  Problem # 1:  HYPOGONADISM (ICD-257.2) Assessment Unchanged We discussed again treatment options  Problem # 2:  FATIGUE (ICD-780.79) Assessment: Unchanged  Problem # 3:  WEIGHT LOSS (ICD-783.21) Assessment: Improved  Problem # 4:  DEPRESSION/ANXIETY (ICD-300.4) Assessment: Improved  Complete Medication List: 1)  Vitamin D3 1000 Unit Tabs (Cholecalciferol) .Marland Kitchen.. 1 by mouth daily 2)  Cialis 10 Mg Tabs (Tadalafil) .Marland Kitchen.. 1 by mouth q1-3 d prn 3)  Vitamin D 56213 Unit Caps (Ergocalciferol) .Marland Kitchen.. 1 once wk x 6 wks then take vit d 1000 international units otc daily 4)  Flexeril 10 Mg Tabs (Cyclobenzaprine hcl) .... 1/2-1 tab by mouth every 8 hours as  needed for muscle relaxant 5)  Mobic 15 Mg Tabs (Meloxicam) .Marland Kitchen.. 1 by mouth once daily x7 days, then as needed 6)  Wellbutrin Sr 150 Mg Xr12h-tab (Bupropion hcl) .Marland Kitchen.. 1 by mouth q am and q lunch (two times a day) 7)  Zolpidem Tartrate 10 Mg Tabs (Zolpidem tartrate) .... 1/2-1 tab at bedtime as needed insomnia 8)  Dhea 25 Mg Tabs (Prasterone (dhea)) .Marland Kitchen.. 1 by mouth qd 9)  Omeprazole 40 Mg Cpdr (Omeprazole) .Marland Kitchen.. 1 by mouth qam for indigestion 10)  Penlac 8 % Soln (Ciclopirox) .... Use once daily on affected nail(s). once a week remove the build-up with an alcohol swab  Contraindications/Deferment of Procedures/Staging:    Test/Procedure: FLU VAX    Reason for deferment: patient declined   Patient Instructions: 1)  Please schedule a follow-up appointment in 3 months. 2)  BMP prior to visit, ICD-9: 3)  Hepatic Panel prior to visit, ICD-9: 4)  testosterone 257.2 Prescriptions: PENLAC 8 % SOLN (CICLOPIROX) Use once daily on affected nail(s). Once a week remove the build-up with an alcohol swab  #1 x 2   Entered and Authorized by:   Tresa Garter MD   Signed by:   Tresa Garter MD on 03/18/2010   Method used:   Print then Give to Patient   RxID:   [id number] OMEPRAZOLE 40 MG CPDR (OMEPRAZOLE) 1 by mouth qam for indigestion  #90 x 3   Entered and Authorized by:   Tresa Garter MD   Signed by:   Tresa Garter MD on 03/18/2010   Method used:   Print then Give to Patient   RxID:   [id number] WELLBUTRIN SR 150 MG XR12H-TAB (BUPROPION HCL) 1 by mouth q am and q lunch (two times a day)  #180 x 1   Entered and Authorized by:   Tresa Garter MD   Signed by:   Tresa Garter MD on 03/18/2010   Method used:   Print then Give to Patient   RxID:   [id number] CIALIS 10 MG TABS (TADALAFIL) 1 by mouth q1-3 d prn  #12 x 12   Entered and Authorized by:   Tresa Garter MD   Signed by:   Tresa Garter MD on 03/18/2010   Method used:   Print  then Give to Patient   RxID:   [id number]    Not Administered:    Influenza Vaccine not given due to: patient condition

## 2010-07-22 NOTE — Assessment & Plan Note (Signed)
Summary: MAY PHYSICAL--PER PT MAR D/T--STC   Vital Signs:  Patient profile:   50 year old male Height:      71 inches Weight:      175 pounds BMI:     24.50 Temp:     98.4 degrees F oral Pulse rate:   78 / minute BP sitting:   142 / 82  (left arm)  Vitals Entered By: Tora Perches (September 16, 2009 11:08 AM) CC: cpx Is Patient Diabetic? No   Primary Care Provider:  Tresa Garter MD  CC:  cpx.  History of Present Illness: The patient presents for a wellness examination  C/o fatigue, insomnia; depressed, anxious C/o occasional diarrhea  Preventive Screening-Counseling & Management  Alcohol-Tobacco     Smoking Status: quit  Caffeine-Diet-Exercise     Does Patient Exercise: yes  Current Medications (verified): 1)  Omeprazole 20 Mg Cpdr (Omeprazole) .... Two By Mouth Daily 2)  Vitamin D3 1000 Unit  Tabs (Cholecalciferol) .Marland Kitchen.. 1 By Mouth Daily 3)  Cialis 10 Mg Tabs (Tadalafil) .Marland Kitchen.. 1 By Mouth Q1-3 D Prn 4)  Vitamin D 00938 Unit Caps (Ergocalciferol) .Marland Kitchen.. 1 Once Wk X 6 Wks Then Take Vit D 1000 International Units Otc Daily 5)  Flexeril 10 Mg Tabs (Cyclobenzaprine Hcl) .... 1/2-1 Tab By Mouth Every 8 Hours As Needed For Muscle Relaxant 6)  Mobic 15 Mg Tabs (Meloxicam) .Marland Kitchen.. 1 By Mouth Once Daily X7 Days, Then As Needed  Allergies: 1)  Viagra (Sildenafil Citrate) 2)  Levitra (Vardenafil Hcl)  Past History:  Past Medical History: Last updated: 07/31/2008 GERD, erosive Dr Marina Goodell ED Hyperlipidemia, mild  Family History: Last updated: 07/31/2008 No CA, no CAD  Social History: Last updated: 09/16/2009 Occupation: Marketing executive Single Former Smoker Alcohol use-yes Regular exercise-yes  Social History: Occupation: Marketing executive Single Former Smoker Alcohol use-yes Regular exercise-yes Does Patient Exercise:  yes  Review of Systems       The patient complains of depression.  The patient denies anorexia, fever, weight loss, weight gain, vision  loss, decreased hearing, hoarseness, chest pain, syncope, dyspnea on exertion, peripheral edema, prolonged cough, headaches, hemoptysis, abdominal pain, melena, hematochezia, severe indigestion/heartburn, hematuria, incontinence, genital sores, muscle weakness, suspicious skin lesions, transient blindness, difficulty walking, unusual weight change, abnormal bleeding, enlarged lymph nodes, angioedema, and breast masses.    Physical Exam  General:  Well-developed,well-nourished,in no acute distress; alert,appropriate and cooperative throughout examination Head:  Normocephalic and atraumatic without obvious abnormalities. No apparent alopecia or balding. Eyes:  No corneal or conjunctival inflammation noted. EOMI. Perrla. Ears:  External ear exam shows no significant lesions or deformities.  Otoscopic examination reveals clear canals, tympanic membranes are intact bilaterally without bulging, retraction, inflammation or discharge. Hearing is grossly normal bilaterally. Nose:  External nasal examination shows no deformity or inflammation. Nasal mucosa are pink and moist without lesions or exudates. Mouth:  Oral mucosa and oropharynx without lesions or exudates.  Teeth in good repair. Neck:  No deformities, masses, or tenderness noted. Lungs:  Normal respiratory effort, chest expands symmetrically. Lungs are clear to auscultation, no crackles or wheezes. Heart:  Normal rate and regular rhythm. S1 and S2 normal without gallop, murmur, click, rub or other extra sounds. Abdomen:  Bowel sounds positive,abdomen soft and non-tender without masses, organomegaly or hernias noted. Genitalia:  Testes bilaterally descended without nodularity, tenderness or masses. No scrotal masses or lesions. No penis lesions or urethral discharge. Msk:  No deformity or scoliosis noted of thoracic or lumbar spine.   Pulses:  R and L carotid,radial,femoral,dorsalis pedis and posterior tibial pulses are full and equal  bilaterally Extremities:  No clubbing, cyanosis, edema, or deformity noted with normal full range of motion of all joints.   Neurologic:  No cranial nerve deficits noted. Station and gait are normal. Plantar reflexes are down-going bilaterally. DTRs are symmetrical throughout. Sensory, motor and coordinative functions appear intact. Skin:  Intact without suspicious lesions or rashes Cervical Nodes:  No lymphadenopathy noted, except for a 1 cm R submand node Inguinal Nodes:  No significant adenopathy Psych:  Cognition and judgment appear intact. Alert and cooperative with normal attention span and concentration. No apparent delusions, illusions, hallucinationsOriented X3, normally interactive, good eye contact, not suicidal, and slightly anxious.     Impression & Recommendations:  Problem # 1:  WELL ADULT EXAM (ICD-V70.0) Assessment New Health and age related issues were discussed. Available screening tests and vaccinations were discussed as well. Healthy life style including good diet and execise was discussed. The labs were reviewed with the patient.  Orders: T-2 View CXR, Same Day (71020.5TC)  Problem # 2:  HYPERLIPIDEMIA (ICD-272.4) Assessment: Comment Only Statins discussed. He will think about it...  Problem # 3:  GERD (ICD-530.81) Assessment: Unchanged EGD is pending  His updated medication list for this problem includes:    Omeprazole 20 Mg Cpdr (Omeprazole) .Marland Kitchen..Marland Kitchen Two by mouth daily  Problem # 4:  DEPRESSION/ANXIETY (ICD-300.4) Assessment: New Start Wellbutrin SR  Problem # 5:  DIARRHEA, CHRONIC (ICD-787.91) Assessment: Unchanged Appt w/Dr Marina Goodell is pending   Problem # 6:  DYSPNEA (ICD-786.05) Assessment: New  Orders: Cardiolite (Cardiolite)  Problem # 7:  ABDOMINAL PAIN, EPIGASTRIC (ICD-789.06) Assessment: Comment Only  Orders: Radiology Referral (Radiology) - abd [place] EGD is pending   Problem # 8:  INSOMNIA, CHRONIC (ICD-307.42) Assessment: New Zolpidem as  needed Risks vs benefits and controversies of a long term benzo use were discussed.   Problem # 9:  ERECTILE DYSFUNCTION (ICD-607.84) Assessment: Unchanged  His updated medication list for this problem includes:    Cialis 10 Mg Tabs (Tadalafil) .Marland Kitchen... 1 by mouth q1-3 d prn  Complete Medication List: 1)  Omeprazole 20 Mg Cpdr (Omeprazole) .... Two by mouth daily 2)  Vitamin D3 1000 Unit Tabs (Cholecalciferol) .Marland Kitchen.. 1 by mouth daily 3)  Cialis 10 Mg Tabs (Tadalafil) .Marland Kitchen.. 1 by mouth q1-3 d prn 4)  Vitamin D 25956 Unit Caps (Ergocalciferol) .Marland Kitchen.. 1 once wk x 6 wks then take vit d 1000 international units otc daily 5)  Flexeril 10 Mg Tabs (Cyclobenzaprine hcl) .... 1/2-1 tab by mouth every 8 hours as needed for muscle relaxant 6)  Mobic 15 Mg Tabs (Meloxicam) .Marland Kitchen.. 1 by mouth once daily x7 days, then as needed 7)  Wellbutrin Sr 150 Mg Xr12h-tab (Bupropion hcl) .Marland Kitchen.. 1 by mouth q am and q lunch (two times a day) 8)  Zolpidem Tartrate 10 Mg Tabs (Zolpidem tartrate) .... 1/2-1 tab at bedtime as needed insomnia  Other Orders: EKG w/ Interpretation (93000) T-Vitamin D (25-Hydroxy) (38756-43329) TLB-B12, Serum-Total ONLY ([address]-Z[address]) TLB-Testosterone, Total (84403-TESTO)  Patient Instructions: 1)  Please schedule a follow-up appointment in 2 months. Prescriptions: ZOLPIDEM TARTRATE 10 MG TABS (ZOLPIDEM TARTRATE) 1/2-1 tab at bedtime as needed insomnia  #30 x 6   Entered and Authorized by:   Tresa Garter MD   Signed by:   Tresa Garter MD on 09/16/2009   Method used:   Print then Give to Patient   RxID:   [id number] WELLBUTRIN SR 150 MG  XR12H-TAB (BUPROPION HCL) 1 by mouth q am and q lunch (two times a day)  #60 x 6   Entered and Authorized by:   Tresa Garter MD   Signed by:   Tresa Garter MD on 09/16/2009   Method used:   Print then Give to Patient   RxID:   [id number]   Prevention & Chronic Care Immunizations   Influenza vaccine: Not documented     Tetanus booster: Not documented    Pneumococcal vaccine: Not documented  Other Screening   Smoking status: quit  (09/16/2009)  Lipids   Total Cholesterol: 230  (09/13/2009)   LDL: DEL  (07/31/2008)   LDL Direct: 171.7  (09/13/2009)   HDL: 37.20  (09/13/2009)   Triglycerides: 144.0  (09/13/2009)    SGOT (AST): 19  (09/13/2009)   SGPT (ALT): 18  (09/13/2009)   Alkaline phosphatase: 62  (09/13/2009)   Total bilirubin: 0.6  (09/13/2009)  Self-Management Support :    Lipid self-management support: Not documented

## 2010-07-22 NOTE — Progress Notes (Signed)
Summary: Nuclear Pre-Procedure  Phone Note Outgoing Call Call back at [phone] CELL   Call placed by: Stanton Kidney, EMT-P,  September 18, 2009 2:31 PM Call placed to: Patient Action Taken: Phone Call Completed Reason for Call: Confirm/change Appt Summary of Call: Reviewed information on [employer] Information Sheet (see scanned document for further details).  Spoke with Patient.    Nuclear Med Background Indications for Stress Test: Evaluation for Ischemia     Symptoms: DOE, Fatigue    Nuclear Pre-Procedure Cardiac Risk Factors: History of Smoking, Lipids Height (in): 71   Appended Document: Nuclear Pre-Procedure Pt. called to cancel and reschedule Stress test at 11:50 09/23/09 due to veterinarian emergency.

## 2010-07-22 NOTE — Letter (Signed)
Summary: New Patient letter  [facility]  [address] [place], [place] [address]   Phone: [phone]  Fax: [phone]       08/07/2009 MRN: [id number]  [facility] [address] [place], [place]  [address]  Dear Mr. SUMMONS,  Welcome to the Gastroenterology Division at [facility].    You are scheduled to see Dr.  Marina Goodell on 09-18-09 at  1:45PM on the 3rd floor at [facility], [address]  We ask that you try to arrive at our office 15 minutes prior to your appointment time to allow for check-in.  We would like you to complete the enclosed self-administered evaluation form prior to your visit and bring it with you on the day of your appointment.  We will review it with you.  Also, please bring a complete list of all your medications or, if you prefer, bring the medication bottles and we will list them.  Please bring your insurance card so that we may make a copy of it.  If your insurance requires a referral to see a specialist, please bring your referral form from your primary care physician.  Co-payments are due at the time of your visit and may be paid by cash, check or credit card.     Your office visit will consist of a consult with your physician (includes a physical exam), any laboratory testing he/she may order, scheduling of any necessary diagnostic testing (e.g. x-ray, ultrasound, CT-scan), and scheduling of a procedure (e.g. Endoscopy, Colonoscopy) if required.  Please allow enough time on your schedule to allow for any/all of these possibilities.    If you cannot keep your appointment, please call [phone] to cancel or reschedule prior to your appointment date.  This allows [place] the opportunity to schedule an appointment for another patient in need of care.  If you do not cancel or reschedule by 5 p.m. the business day prior to your appointment date, you will be charged a $50.00 late cancellation/no-show fee.    Thank you for  choosing [facility] for your medical needs.  We appreciate the opportunity to care for you.  Please visit [place] at our website  to learn more about our practice.                     Sincerely,                                                             The Gastroenterology Division

## 2010-11-19 ENCOUNTER — Encounter: Payer: Self-pay | Admitting: Internal Medicine

## 2010-11-19 ENCOUNTER — Other Ambulatory Visit (INDEPENDENT_AMBULATORY_CARE_PROVIDER_SITE_OTHER): Payer: BC Managed Care – PPO

## 2010-11-19 ENCOUNTER — Ambulatory Visit (INDEPENDENT_AMBULATORY_CARE_PROVIDER_SITE_OTHER): Payer: BC Managed Care – PPO | Admitting: Internal Medicine

## 2010-11-19 DIAGNOSIS — R634 Abnormal weight loss: Secondary | ICD-10-CM

## 2010-11-19 DIAGNOSIS — R509 Fever, unspecified: Secondary | ICD-10-CM

## 2010-11-19 DIAGNOSIS — R5383 Other fatigue: Secondary | ICD-10-CM

## 2010-11-19 DIAGNOSIS — F341 Dysthymic disorder: Secondary | ICD-10-CM

## 2010-11-19 DIAGNOSIS — R5381 Other malaise: Secondary | ICD-10-CM

## 2010-11-19 DIAGNOSIS — E785 Hyperlipidemia, unspecified: Secondary | ICD-10-CM

## 2010-11-19 DIAGNOSIS — E291 Testicular hypofunction: Secondary | ICD-10-CM

## 2010-11-19 LAB — CBC WITH DIFFERENTIAL/PLATELET
Basophils Relative: 0 % (ref 0.0–3.0)
Eosinophils Absolute: 0 10*3/uL (ref 0.0–0.7)
Eosinophils Relative: 0.3 % (ref 0.0–5.0)
Hemoglobin: 14.9 g/dL (ref 13.0–17.0)
MCHC: 34.8 g/dL (ref 30.0–36.0)
MCV: 94.5 fl (ref 78.0–100.0)
Monocytes Absolute: 0.5 10*3/uL (ref 0.1–1.0)
Neutro Abs: 6.7 10*3/uL (ref 1.4–7.7)
RBC: 4.52 Mil/uL (ref 4.22–5.81)
WBC: 8.2 10*3/uL (ref 4.5–10.5)

## 2010-11-19 LAB — COMPREHENSIVE METABOLIC PANEL
AST: 18 U/L (ref 0–37)
Alkaline Phosphatase: 58 U/L (ref 39–117)
BUN: 14 mg/dL (ref 6–23)
Creatinine, Ser: 1.2 mg/dL (ref 0.4–1.5)
Glucose, Bld: 98 mg/dL (ref 70–99)
Total Bilirubin: 1.2 mg/dL (ref 0.3–1.2)

## 2010-11-19 MED ORDER — DOXYCYCLINE HYCLATE 100 MG PO TABS: 100.0000 mg | ORAL_TABLET | Freq: Two times a day (BID) | ORAL | Status: AC

## 2010-11-19 NOTE — Progress Notes (Signed)
  Subjective:    Patient ID: David Hinton, male    DOB: 13-Feb-1961, 50 y.o.   MRN: [id number]  HPI  C/o fatigue x wks and multiple tick bites over past few wks.   He did not feel well and had chills.He has been stressed out. He is asking to be checked out with labs.  Review of Systems  Constitutional: Positive for chills and fatigue. Negative for appetite change and unexpected weight change.  HENT: Negative for nosebleeds, congestion, sore throat, sneezing, trouble swallowing and neck pain.   Eyes: Negative for itching and visual disturbance.  Respiratory: Negative for cough.   Cardiovascular: Negative for chest pain, palpitations and leg swelling.  Gastrointestinal: Negative for nausea, diarrhea, blood in stool and abdominal distention.  Genitourinary: Negative for frequency and hematuria.  Musculoskeletal: Negative for back pain, joint swelling and gait problem.  Skin: Negative for rash.  Neurological: Negative for dizziness, tremors, speech difficulty, weakness and headaches.  Psychiatric/Behavioral: Negative for suicidal ideas, behavioral problems, sleep disturbance, dysphoric mood and agitation. The patient is not nervous/anxious.        Objective:   Physical Exam  Constitutional: He is oriented to person, place, and time. He appears well-developed.  HENT:  Mouth/Throat: Oropharynx is clear and moist.  Eyes: Conjunctivae are normal. Pupils are equal, round, and reactive to light.  Neck: Normal range of motion. No JVD present. No thyromegaly present.  Cardiovascular: Normal rate, regular rhythm, normal heart sounds and intact distal pulses.  Exam reveals no gallop and no friction rub.   No murmur heard. Pulmonary/Chest: Effort normal and breath sounds normal. No respiratory distress. He has no wheezes. He has no rales. He exhibits no tenderness.  Abdominal: Soft. Bowel sounds are normal. He exhibits no distension and no mass. There is no tenderness. There is no rebound and no  guarding.  Musculoskeletal: Normal range of motion. He exhibits no edema and no tenderness.  Lymphadenopathy:    He has no cervical adenopathy.  Neurological: He is alert and oriented to person, place, and time. He has normal reflexes. No cranial nerve deficit. He exhibits normal muscle tone. Coordination normal.  Skin: Skin is warm and dry. Rash (small 1-2 mm papules - small #, scattered) noted.  Psychiatric: He has a normal mood and affect. His behavior is normal. Judgment and thought content normal.          Assessment & Plan:   Will get labs

## 2010-11-21 ENCOUNTER — Telehealth: Payer: Self-pay | Admitting: Internal Medicine

## 2010-11-21 LAB — B. BURGDORFI ANTIBODIES BY WB: B burgdorferi IgM Abs (IB): NEGATIVE

## 2010-11-21 NOTE — Telephone Encounter (Signed)
David Hinton, please, inform patient that all labs are normal and Lyme test is neg  Please, mail the labs to the patient.    Thx

## 2010-11-23 ENCOUNTER — Encounter: Payer: Self-pay | Admitting: Internal Medicine

## 2010-11-24 NOTE — Telephone Encounter (Signed)
Left detailed mess informing pt of below./copies mailed 

## 2010-12-12 ENCOUNTER — Encounter: Payer: Self-pay | Admitting: Internal Medicine

## 2010-12-12 NOTE — Assessment & Plan Note (Signed)
He will think about it

## 2010-12-12 NOTE — Assessment & Plan Note (Signed)
On a good diet 

## 2010-12-12 NOTE — Assessment & Plan Note (Signed)
Will get labs 

## 2011-07-20 ENCOUNTER — Other Ambulatory Visit: Payer: Self-pay | Admitting: *Deleted

## 2011-07-20 MED ORDER — TADALAFIL 5 MG PO TABS: 5.0000 mg | ORAL_TABLET | Freq: Every day | ORAL | Status: DC | PRN

## 2011-07-20 MED ORDER — TADALAFIL 10 MG PO TABS: 10.0000 mg | ORAL_TABLET | Freq: Every day | ORAL | Status: DC | PRN

## 2011-08-16 ENCOUNTER — Emergency Department (HOSPITAL_COMMUNITY)
Admission: EM | Admit: 2011-08-16 | Discharge: 2011-08-16 | Disposition: A | Discharge status: Home / Self Care | Payer: BC Managed Care – PPO | Attending: Emergency Medicine | Admitting: Emergency Medicine

## 2011-08-16 ENCOUNTER — Encounter (HOSPITAL_COMMUNITY): Payer: Self-pay | Admitting: *Deleted

## 2011-08-16 DIAGNOSIS — S022XXA Fracture of nasal bones, initial encounter for closed fracture: Secondary | ICD-10-CM

## 2011-08-16 MED ORDER — NAPROXEN 500 MG PO TABS: 500.0000 mg | ORAL_TABLET | Freq: Two times a day (BID) | ORAL | Status: AC

## 2011-08-16 MED ORDER — HYDROCODONE-ACETAMINOPHEN 5-500 MG PO TABS
1.0000 | ORAL_TABLET | Freq: Four times a day (QID) | ORAL | Status: AC | PRN
Start: 2011-08-16 — End: 2011-08-26

## 2011-08-16 MED ORDER — LORAZEPAM 1 MG PO TABS: 1.0000 mg | ORAL_TABLET | Freq: Three times a day (TID) | ORAL | Status: AC | PRN

## 2011-08-16 MED ORDER — LORAZEPAM 1 MG PO TABS
1.0000 mg | ORAL_TABLET | Freq: Once | ORAL | Status: DC
  Filled 2011-08-16: qty 1
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
## 2011-08-16 NOTE — Discharge Instructions (Signed)
Nasal Fracture      A nasal fracture is a break or crack in the bones of the nose. A minor break usually heals in a month. You often will receive black eyes from a nasal fracture. This is not a cause for concern. The black eyes will go away over 1 to 2 weeks.  DIAGNOSIS  Your caregiver may want to examine you if you are concerned about a fracture of the nose. X-rays of the nose may not show a nasal fracture even when one is present. Sometimes your caregiver must wait 1 to 5 days after the injury to re-check the nose for alignment and to take additional X-rays. Sometimes the caregiver must wait until the swelling has gone down.  TREATMENT  Minor fractures that have caused no deformity often do not require treatment. More serious fractures where bones are displaced may require surgery. This will take place after the swelling is gone. Surgery will stabilize and align the fracture.  HOME CARE INSTRUCTIONS  Put ice on the injured area.   Put ice in a plastic bag.   Place a towel between your skin and the bag.   Leave the ice on for 15 to 20 minutes, 3 to 4 times a day.   Take medications as directed by your caregiver.   Only take over-the-counter or prescription medicines for pain, discomfort, or fever as directed by your caregiver.   If your nose starts bleeding, squeeze the soft parts of the nose against the center wall while you are sitting in an upright position for 10 minutes.   Contact sports should be avoided for at least 3 to 4 weeks or as directed by your caregiver.   SEEK MEDICAL CARE IF:  Your pain increases or becomes severe.   You continue to have nosebleeds.   The shape of your nose does not return to normal within 5 days.   You have pus draining from the nose.   SEEK IMMEDIATE MEDICAL CARE IF:  You have bleeding from your nose that does not stop after 20 minutes of pinching the nostrils closed and keeping ice on the nose.   You have clear fluid draining from your nose.   You notice a grape-like swelling on  the dividing wall between the nostrils ( septum). This is a collection of blood ( hematoma) that must be drained to help prevent infection.   You have difficulty moving your eyes.   You have recurrent vomiting.   Document Released: 06/05/2000 Document Revised: 02/18/2011 Document Reviewed: 09/22/2010  Exit[employer] Patient Information 2012 [employer].
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
## 2011-10-20 ENCOUNTER — Telehealth: Payer: Self-pay | Admitting: Internal Medicine

## 2011-10-20 NOTE — Telephone Encounter (Signed)
Hi, Lou,  Any suggestions/comments? Thanks,

## 2011-10-20 NOTE — Telephone Encounter (Signed)
The patient called and demanded a cpe done by May 20th.  I advised the patient that Dr.Plotnikov is booked out until mid July for physicals.  The patient became angry and started and yelling that it was completely unacceptable to be booked that far out.  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  I explained to the patient that our communication would have to end due to the loud nature of the call.  If you want him worked in, I will have another scheduler call him to accomodate his request.

## 2011-12-16 ENCOUNTER — Ambulatory Visit: Payer: BC Managed Care – PPO | Admitting: Internal Medicine

## 2011-12-16 DIAGNOSIS — Z0289 Encounter for other administrative examinations: Secondary | ICD-10-CM

## 2012-02-26 ENCOUNTER — Encounter: Payer: Self-pay | Admitting: Internal Medicine

## 2012-02-26 ENCOUNTER — Ambulatory Visit (INDEPENDENT_AMBULATORY_CARE_PROVIDER_SITE_OTHER): Payer: Self-pay | Admitting: Internal Medicine

## 2012-02-26 VITALS — BP 148/108 | HR 84 | Temp 97.7°F | Resp 16 | Wt 173.0 lb

## 2012-02-26 DIAGNOSIS — F341 Dysthymic disorder: Secondary | ICD-10-CM

## 2012-02-26 DIAGNOSIS — S0081XA Abrasion of other part of head, initial encounter: Secondary | ICD-10-CM | POA: Insufficient documentation

## 2012-02-26 DIAGNOSIS — IMO0002 Reserved for concepts with insufficient information to code with codable children: Secondary | ICD-10-CM

## 2012-02-26 DIAGNOSIS — N529 Male erectile dysfunction, unspecified: Secondary | ICD-10-CM

## 2012-02-26 DIAGNOSIS — G4700 Insomnia, unspecified: Secondary | ICD-10-CM

## 2012-02-26 MED ORDER — CLONAZEPAM 0.5 MG PO TABS: 0.5000 mg | ORAL_TABLET | Freq: Every evening | ORAL | Status: DC | PRN

## 2012-02-26 MED ORDER — NORTRIPTYLINE HCL 10 MG PO CAPS: 10.0000 mg | ORAL_CAPSULE | Freq: Every day | ORAL | Status: DC

## 2012-02-26 NOTE — Assessment & Plan Note (Signed)
9/13 small - no need for sutures Wound care discussed DT w/in 10 years

## 2012-02-26 NOTE — Progress Notes (Signed)
   Subjective:    Patient ID: David Hinton, male    DOB: [birth year], 51 y.o.   MRN: [id number]  HPI  C/o a truck door hit hit his forehead yesterday. No LOC. C/o insomnia, chronic.  Stressed - family stress. He is in school now - Public Health  BP Readings from Last 3 Encounters:  02/26/12 148/108  08/16/11 168/102  12/12/10 110/70   Wt Readings from Last 3 Encounters:  02/26/12 173 lb (78.472 kg)  03/18/10 172 lb (78.019 kg)  01/27/10 171 lb (77.565 kg)      Review of Systems  Constitutional: Negative for chills, appetite change, fatigue and unexpected weight change.  HENT: Negative for nosebleeds, congestion, sore throat, sneezing, trouble swallowing and neck pain.   Eyes: Negative for itching and visual disturbance.  Respiratory: Negative for cough.   Cardiovascular: Negative for chest pain, palpitations and leg swelling.  Gastrointestinal: Negative for nausea, diarrhea, blood in stool and abdominal distention.  Genitourinary: Negative for frequency and hematuria.  Musculoskeletal: Negative for back pain, joint swelling and gait problem.  Skin: Negative for rash.  Neurological: Negative for dizziness, tremors, facial asymmetry, speech difficulty, weakness, light-headedness and headaches.  Psychiatric/Behavioral: Positive for disturbed wake/sleep cycle. Negative for suicidal ideas, behavioral problems, dysphoric mood, decreased concentration and agitation. The patient is not nervous/anxious.        Objective:   Physical Exam  Constitutional: He is oriented to person, place, and time. He appears well-developed.  HENT:  Mouth/Throat: Oropharynx is clear and moist.  Eyes: Conjunctivae are normal. Pupils are equal, round, and reactive to light.  Neck: Normal range of motion. No JVD present. No thyromegaly present.  Cardiovascular: Normal rate, regular rhythm, normal heart sounds and intact distal pulses.  Exam reveals no gallop and no friction rub.   No murmur  heard. Pulmonary/Chest: Effort normal and breath sounds normal. No respiratory distress. He has no wheezes. He has no rales. He exhibits no tenderness.  Abdominal: Soft. Bowel sounds are normal. He exhibits no distension and no mass. There is no tenderness. There is no rebound and no guarding.  Musculoskeletal: Normal range of motion. He exhibits no edema and no tenderness.  Lymphadenopathy:    He has no cervical adenopathy.  Neurological: He is alert and oriented to person, place, and time. He has normal reflexes. No cranial nerve deficit. He exhibits normal muscle tone. Coordination normal.  Skin: Skin is warm and dry. No rash (small 1-2 mm papules - small #, scattered) noted.       1.3 cm abrasion L forehead /a scab  Psychiatric: He has a normal mood and affect. His behavior is normal. Judgment and thought content normal.   Lab Results  Component Value Date   WBC 8.2 11/19/2010   HGB 14.9 11/19/2010   HCT 42.7 11/19/2010   PLT 163.0 11/19/2010   GLUCOSE 98 11/19/2010   CHOL 230* 09/13/2009   TRIG 144.0 09/13/2009   HDL 37.20* 09/13/2009   LDLDIRECT 171.7 09/13/2009   ALT 20 11/19/2010   AST 18 11/19/2010   NA 136 11/19/2010   K 4.4 11/19/2010   CL 100 11/19/2010   CREATININE 1.2 11/19/2010   BUN 14 11/19/2010   CO2 28 11/19/2010   TSH 1.01 11/19/2010   PSA 0.54 09/13/2009          Assessment & Plan:   Will get labs

## 2012-02-26 NOTE — Assessment & Plan Note (Signed)
Discussed Levitra samples given

## 2012-02-26 NOTE — Assessment & Plan Note (Addendum)
Nortriptyline qhs and/or Clonazepam prn

## 2012-03-04 NOTE — Assessment & Plan Note (Signed)
Discussed Nortriptyline qhs

## 2012-05-16 ENCOUNTER — Other Ambulatory Visit: Payer: Self-pay | Admitting: *Deleted

## 2012-05-16 MED ORDER — BUPROPION HCL ER (SR) 150 MG PO TB12: 150.0000 mg | ORAL_TABLET | Freq: Two times a day (BID) | ORAL | Status: DC

## 2012-05-16 NOTE — Telephone Encounter (Signed)
R'cd fax from [facility] for refill of Wellbutrin.

## 2012-08-01 ENCOUNTER — Other Ambulatory Visit: Payer: Self-pay | Admitting: Internal Medicine

## 2012-09-27 ENCOUNTER — Encounter: Payer: Self-pay | Admitting: Internal Medicine

## 2012-09-27 ENCOUNTER — Ambulatory Visit (INDEPENDENT_AMBULATORY_CARE_PROVIDER_SITE_OTHER): Payer: Self-pay | Admitting: Internal Medicine

## 2012-09-27 ENCOUNTER — Ambulatory Visit (INDEPENDENT_AMBULATORY_CARE_PROVIDER_SITE_OTHER): Payer: Self-pay

## 2012-09-27 VITALS — BP 138/90 | HR 72 | Temp 98.2°F | Resp 16 | Wt 168.0 lb

## 2012-09-27 DIAGNOSIS — R509 Fever, unspecified: Secondary | ICD-10-CM

## 2012-09-27 DIAGNOSIS — N453 Epididymo-orchitis: Secondary | ICD-10-CM

## 2012-09-27 DIAGNOSIS — N451 Epididymitis: Secondary | ICD-10-CM

## 2012-09-27 LAB — URINALYSIS, ROUTINE W REFLEX MICROSCOPIC
Leukocytes, UA: NEGATIVE
Urine Glucose: NEGATIVE
Urobilinogen, UA: 0.2 (ref 0.0–1.0)

## 2012-09-27 LAB — CBC WITH DIFFERENTIAL/PLATELET
Basophils Relative: 1.2 % (ref 0.0–3.0)
Eosinophils Absolute: 0 10*3/uL (ref 0.0–0.7)
Hemoglobin: 12.8 g/dL — ABNORMAL LOW (ref 13.0–17.0)
Lymphocytes Relative: 15.6 % (ref 12.0–46.0)
MCHC: 34.1 g/dL (ref 30.0–36.0)
Monocytes Relative: 9.4 % (ref 3.0–12.0)
Neutro Abs: 5.8 10*3/uL (ref 1.4–7.7)
Neutrophils Relative %: 73.2 % (ref 43.0–77.0)
RBC: 4.09 Mil/uL — ABNORMAL LOW (ref 4.22–5.81)
WBC: 8 10*3/uL (ref 4.5–10.5)

## 2012-09-27 MED ORDER — ZOLPIDEM TARTRATE 10 MG PO TABS: 10.0000 mg | ORAL_TABLET | Freq: Every evening | ORAL | Status: DC | PRN

## 2012-09-27 MED ORDER — BUPROPION HCL ER (SR) 150 MG PO TB12: 150.0000 mg | ORAL_TABLET | Freq: Two times a day (BID) | ORAL | Status: DC

## 2012-09-27 MED ORDER — CIPROFLOXACIN HCL 500 MG PO TABS: 500.0000 mg | ORAL_TABLET | Freq: Two times a day (BID) | ORAL | Status: DC

## 2012-09-27 NOTE — Progress Notes (Signed)
Subjective:    Groin Pain The patient's primary symptoms include testicular pain (R). The patient's pertinent negatives include no genital injury, genital lesions or scrotal swelling. This is a new problem. The current episode started in the past 7 days. The problem occurs constantly. Pertinent negatives include no chest pain, chills, coughing, diarrhea, frequency, headaches, nausea, rash or sore throat.    C/o insomnia, chronic.  Stressed - family stress. He is in school now - Public Health  BP Readings from Last 3 Encounters:  09/27/12 138/90  02/26/12 148/108  08/16/11 168/102   Wt Readings from Last 3 Encounters:  09/27/12 168 lb (76.204 kg)  02/26/12 173 lb (78.472 kg)  03/18/10 172 lb (78.019 kg)      Review of Systems  Constitutional: Negative for chills, appetite change, fatigue and unexpected weight change.  HENT: Negative for nosebleeds, congestion, sore throat, sneezing, trouble swallowing and neck pain.   Eyes: Negative for itching and visual disturbance.  Respiratory: Negative for cough.   Cardiovascular: Negative for chest pain, palpitations and leg swelling.  Gastrointestinal: Negative for nausea, diarrhea, blood in stool and abdominal distention.  Genitourinary: Positive for testicular pain (R). Negative for frequency, hematuria and scrotal swelling.  Musculoskeletal: Negative for back pain, joint swelling and gait problem.  Skin: Negative for rash.  Neurological: Negative for dizziness, tremors, facial asymmetry, speech difficulty, weakness, light-headedness and headaches.  Psychiatric/Behavioral: Positive for sleep disturbance. Negative for suicidal ideas, behavioral problems, dysphoric mood, decreased concentration and agitation. The patient is not nervous/anxious.        Objective:   Physical Exam  Constitutional: He is oriented to person, place, and time. He appears well-developed.  HENT:  Mouth/Throat: Oropharynx is clear and moist.  Eyes:  Conjunctivae are normal. Pupils are equal, round, and reactive to light.  Neck: Normal range of motion. No JVD present. No thyromegaly present.  Cardiovascular: Normal rate, regular rhythm, normal heart sounds and intact distal pulses.  Exam reveals no gallop and no friction rub.   No murmur heard. Pulmonary/Chest: Effort normal and breath sounds normal. No respiratory distress. He has no wheezes. He has no rales. He exhibits no tenderness.  Abdominal: Soft. Bowel sounds are normal. He exhibits no distension and no mass. There is no tenderness. There is no rebound and no guarding.  Musculoskeletal: Normal range of motion. He exhibits no edema and no tenderness.  Lymphadenopathy:    He has no cervical adenopathy.  Neurological: He is alert and oriented to person, place, and time. He has normal reflexes. No cranial nerve deficit. He exhibits normal muscle tone. Coordination normal.  Skin: Skin is warm and dry. No rash (small 1-2 mm papules - small #, scattered) noted.  1.3 cm abrasion L forehead /a scab  Psychiatric: He has a normal mood and affect. His behavior is normal. Judgment and thought content normal.   Lab Results  Component Value Date   WBC 8.2 11/19/2010   HGB 14.9 11/19/2010   HCT 42.7 11/19/2010   PLT 163.0 11/19/2010   GLUCOSE 98 11/19/2010   CHOL 230* 09/13/2009   TRIG 144.0 09/13/2009   HDL 37.20* 09/13/2009   LDLDIRECT 171.7 09/13/2009   ALT 20 11/19/2010   AST 18 11/19/2010   NA 136 11/19/2010   K 4.4 11/19/2010   CL 100 11/19/2010   CREATININE 1.2 11/19/2010   BUN 14 11/19/2010   CO2 28 11/19/2010   TSH 1.01 11/19/2010   PSA 0.54 09/13/2009  Assessment & Plan:

## 2012-09-28 ENCOUNTER — Encounter: Payer: Self-pay | Admitting: Internal Medicine

## 2012-09-28 DIAGNOSIS — R509 Fever, unspecified: Secondary | ICD-10-CM | POA: Insufficient documentation

## 2012-09-28 LAB — COMPREHENSIVE METABOLIC PANEL
ALT: 26 U/L (ref 0–53)
AST: 25 U/L (ref 0–37)
Albumin: 3.7 g/dL (ref 3.5–5.2)
BUN: 16 mg/dL (ref 6–23)
CO2: 29 mEq/L (ref 19–32)
Calcium: 9.1 mg/dL (ref 8.4–10.5)
Chloride: 102 mEq/L (ref 96–112)
GFR: 79.73 mL/min (ref >60.00)
Potassium: 4.4 mEq/L (ref 3.5–5.1)

## 2012-09-28 NOTE — Assessment & Plan Note (Signed)
4/14 R Start Cipro Labs

## 2012-10-04 ENCOUNTER — Other Ambulatory Visit: Payer: Self-pay | Admitting: Internal Medicine

## 2012-10-11 ENCOUNTER — Ambulatory Visit (INDEPENDENT_AMBULATORY_CARE_PROVIDER_SITE_OTHER): Payer: Self-pay | Admitting: Internal Medicine

## 2012-10-11 ENCOUNTER — Encounter: Payer: Self-pay | Admitting: Internal Medicine

## 2012-10-11 VITALS — BP 130/82 | HR 76 | Resp 16 | Wt 169.0 lb

## 2012-10-11 DIAGNOSIS — R634 Abnormal weight loss: Secondary | ICD-10-CM

## 2012-10-11 DIAGNOSIS — R509 Fever, unspecified: Secondary | ICD-10-CM

## 2012-10-11 DIAGNOSIS — D62 Acute posthemorrhagic anemia: Secondary | ICD-10-CM

## 2012-10-11 DIAGNOSIS — D649 Anemia, unspecified: Secondary | ICD-10-CM

## 2012-10-11 DIAGNOSIS — J019 Acute sinusitis, unspecified: Secondary | ICD-10-CM

## 2012-10-11 MED ORDER — AMOXICILLIN-POT CLAVULANATE 875-125 MG PO TABS: 1.0000 | ORAL_TABLET | Freq: Two times a day (BID) | ORAL | Status: DC

## 2012-10-11 NOTE — Assessment & Plan Note (Signed)
Netty pot Augmentin x 2 wks

## 2012-10-11 NOTE — Assessment & Plan Note (Signed)
Wt Readings from Last 3 Encounters:  10/11/12 169 lb (76.658 kg)  09/27/12 168 lb (76.204 kg)  02/26/12 173 lb (78.472 kg)

## 2012-10-11 NOTE — Progress Notes (Signed)
   Subjective:    Sinusitis Pertinent negatives include no congestion, neck pain or sneezing.    C/o insomnia, chronic.  Stressed - family stress. He is in school now - Public Health  BP Readings from Last 3 Encounters:  10/11/12 130/82  09/27/12 138/90  02/26/12 148/108   Wt Readings from Last 3 Encounters:  10/11/12 169 lb (76.658 kg)  09/27/12 168 lb (76.204 kg)  02/26/12 173 lb (78.472 kg)      Review of Systems  Constitutional: Negative for appetite change, fatigue and unexpected weight change.  HENT: Negative for nosebleeds, congestion, sneezing, trouble swallowing and neck pain.   Eyes: Negative for itching and visual disturbance.  Cardiovascular: Negative for palpitations and leg swelling.  Gastrointestinal: Negative for blood in stool and abdominal distention.  Genitourinary: Negative for hematuria.  Musculoskeletal: Negative for back pain, joint swelling and gait problem.  Neurological: Negative for dizziness, tremors, facial asymmetry, speech difficulty, weakness and light-headedness.  Psychiatric/Behavioral: Positive for sleep disturbance. Negative for suicidal ideas, behavioral problems, dysphoric mood, decreased concentration and agitation. The patient is not nervous/anxious.        Objective:   Physical Exam  Constitutional: He is oriented to person, place, and time. He appears well-developed.  HENT:  Mouth/Throat: Oropharynx is clear and moist.  Eyes: Conjunctivae are normal. Pupils are equal, round, and reactive to light.  Neck: Normal range of motion. No JVD present. No thyromegaly present.  Cardiovascular: Normal rate, regular rhythm, normal heart sounds and intact distal pulses.  Exam reveals no gallop and no friction rub.   No murmur heard. Pulmonary/Chest: Effort normal and breath sounds normal. No respiratory distress. He has no wheezes. He has no rales. He exhibits no tenderness.  Abdominal: Soft. Bowel sounds are normal. He exhibits no distension  and no mass. There is no tenderness. There is no rebound and no guarding.  Musculoskeletal: Normal range of motion. He exhibits no edema and no tenderness.  Lymphadenopathy:    He has no cervical adenopathy.  Neurological: He is alert and oriented to person, place, and time. He has normal reflexes. No cranial nerve deficit. He exhibits normal muscle tone. Coordination normal.  Skin: Skin is warm and dry. No rash (small 1-2 mm papules - small #, scattered) noted.  1.3 cm abrasion L forehead /a scab  Psychiatric: He has a normal mood and affect. His behavior is normal. Judgment and thought content normal.   Lab Results  Component Value Date   WBC 8.0 09/27/2012   HGB 12.8* 09/27/2012   HCT 37.6* 09/27/2012   PLT 236.0 09/27/2012   GLUCOSE 94 09/27/2012   CHOL 230* 09/13/2009   TRIG 144.0 09/13/2009   HDL 37.20* 09/13/2009   LDLDIRECT 171.7 09/13/2009   ALT 26 09/27/2012   AST 25 09/27/2012   NA 137 09/27/2012   K 4.4 09/27/2012   CL 102 09/27/2012   CREATININE 1.0 09/27/2012   BUN 16 09/27/2012   CO2 29 09/27/2012   TSH 1.01 11/19/2010   PSA 0.54 09/13/2009          Assessment & Plan:

## 2012-10-11 NOTE — Assessment & Plan Note (Signed)
Discussed.

## 2012-12-21 ENCOUNTER — Telehealth: Payer: Self-pay | Admitting: *Deleted

## 2012-12-21 MED ORDER — TADALAFIL 5 MG PO TABS: 5.0000 mg | ORAL_TABLET | Freq: Every day | ORAL | Status: DC | PRN

## 2012-12-21 NOTE — Telephone Encounter (Signed)
New Rx sent.

## 2012-12-21 NOTE — Telephone Encounter (Signed)
Ok #30 and 4 ref Thx

## 2012-12-21 NOTE — Telephone Encounter (Signed)
Rf req for Cialis 5 mg # 10  instead of 10 mg due to cost savings and side effects with full 10 mg. Please advise.

## 2013-04-27 ENCOUNTER — Other Ambulatory Visit: Payer: Self-pay

## 2013-05-10 ENCOUNTER — Other Ambulatory Visit: Payer: Self-pay | Admitting: Internal Medicine

## 2013-11-16 ENCOUNTER — Encounter: Payer: Self-pay | Admitting: Internal Medicine

## 2013-11-16 ENCOUNTER — Other Ambulatory Visit (INDEPENDENT_AMBULATORY_CARE_PROVIDER_SITE_OTHER): Payer: Self-pay

## 2013-11-16 ENCOUNTER — Ambulatory Visit (INDEPENDENT_AMBULATORY_CARE_PROVIDER_SITE_OTHER): Payer: Self-pay | Admitting: Internal Medicine

## 2013-11-16 VITALS — BP 170/108 | HR 80 | Temp 98.5°F | Resp 16 | Wt 169.0 lb

## 2013-11-16 DIAGNOSIS — E559 Vitamin D deficiency, unspecified: Secondary | ICD-10-CM

## 2013-11-16 DIAGNOSIS — R030 Elevated blood-pressure reading, without diagnosis of hypertension: Secondary | ICD-10-CM

## 2013-11-16 DIAGNOSIS — D62 Acute posthemorrhagic anemia: Secondary | ICD-10-CM

## 2013-11-16 DIAGNOSIS — Z0000 Encounter for general adult medical examination without abnormal findings: Secondary | ICD-10-CM | POA: Insufficient documentation

## 2013-11-16 DIAGNOSIS — R634 Abnormal weight loss: Secondary | ICD-10-CM

## 2013-11-16 DIAGNOSIS — D649 Anemia, unspecified: Secondary | ICD-10-CM

## 2013-11-16 DIAGNOSIS — J019 Acute sinusitis, unspecified: Secondary | ICD-10-CM

## 2013-11-16 DIAGNOSIS — IMO0001 Reserved for inherently not codable concepts without codable children: Secondary | ICD-10-CM

## 2013-11-16 DIAGNOSIS — Z733 Stress, not elsewhere classified: Secondary | ICD-10-CM

## 2013-11-16 DIAGNOSIS — F439 Reaction to severe stress, unspecified: Secondary | ICD-10-CM

## 2013-11-16 DIAGNOSIS — R509 Fever, unspecified: Secondary | ICD-10-CM

## 2013-11-16 LAB — BASIC METABOLIC PANEL
BUN: 15 mg/dL (ref 6–23)
CALCIUM: 9.9 mg/dL (ref 8.4–10.5)
CHLORIDE: 102 meq/L (ref 96–112)
CO2: 28 mEq/L (ref 19–32)
Creatinine, Ser: 1.1 mg/dL (ref 0.4–1.5)
GFR: 74.41 mL/min (ref >60.00)
Glucose, Bld: 102 mg/dL — ABNORMAL HIGH (ref 70–99)
Potassium: 4.2 mEq/L (ref 3.5–5.1)
Sodium: 138 mEq/L (ref 135–145)

## 2013-11-16 LAB — CBC WITH DIFFERENTIAL/PLATELET
Basophils Absolute: 0 10*3/uL (ref 0.0–0.1)
Basophils Relative: 0.3 % (ref 0.0–3.0)
Eosinophils Absolute: 0.1 10*3/uL (ref 0.0–0.7)
Eosinophils Relative: 1.1 % (ref 0.0–5.0)
HCT: 44.1 % (ref 39.0–52.0)
Hemoglobin: 15.2 g/dL (ref 13.0–17.0)
Lymphocytes Relative: 21.1 % (ref 12.0–46.0)
Lymphs Abs: 1.4 10*3/uL (ref 0.7–4.0)
MCHC: 34.5 g/dL (ref 30.0–36.0)
MCV: 92.3 fl (ref 78.0–100.0)
Monocytes Absolute: 0.6 10*3/uL (ref 0.1–1.0)
Monocytes Relative: 9.4 % (ref 3.0–12.0)
Neutro Abs: 4.5 10*3/uL (ref 1.4–7.7)
Neutrophils Relative %: 68.1 % (ref 43.0–77.0)
Platelets: 233 10*3/uL (ref 150.0–400.0)
RBC: 4.78 Mil/uL (ref 4.22–5.81)
RDW: 13.1 % (ref 11.5–15.5)
WBC: 6.6 10*3/uL (ref 4.0–10.5)

## 2013-11-16 LAB — URINALYSIS
BILIRUBIN URINE: NEGATIVE
Ketones, ur: NEGATIVE
Leukocytes, UA: NEGATIVE
NITRITE: NEGATIVE
PH: 6 (ref 5.0–8.0)
Specific Gravity, Urine: 1.015 (ref 1.000–1.030)
TOTAL PROTEIN, URINE-UPE24: NEGATIVE
Urine Glucose: NEGATIVE
Urobilinogen, UA: 0.2 (ref 0.0–1.0)

## 2013-11-16 LAB — HEPATIC FUNCTION PANEL
ALT: 17 U/L (ref 0–53)
AST: 18 U/L (ref 0–37)
Albumin: 4.5 g/dL (ref 3.5–5.2)
Alkaline Phosphatase: 59 U/L (ref 39–117)
BILIRUBIN DIRECT: 0.2 mg/dL (ref 0.0–0.3)
TOTAL PROTEIN: 7.7 g/dL (ref 6.0–8.3)
Total Bilirubin: 0.9 mg/dL (ref 0.2–1.2)

## 2013-11-16 LAB — LIPID PANEL
Cholesterol: 205 mg/dL — ABNORMAL HIGH (ref 0–200)
HDL: 37.2 mg/dL — ABNORMAL LOW (ref >39.00)
LDL CALC: 146 mg/dL — AB (ref 0–99)
TRIGLYCERIDES: 109 mg/dL (ref 0.0–149.0)
Total CHOL/HDL Ratio: 6
VLDL: 21.8 mg/dL (ref 0.0–40.0)

## 2013-11-16 LAB — IBC PANEL
Iron: 163 ug/dL (ref 42–165)
SATURATION RATIOS: 39.6 % (ref 20.0–50.0)
TRANSFERRIN: 294.3 mg/dL (ref 212.0–360.0)

## 2013-11-16 LAB — PSA: PSA: 0.75 ng/mL (ref 0.10–4.00)

## 2013-11-16 LAB — TSH: TSH: 1.6 u[IU]/mL (ref 0.35–4.50)

## 2013-11-16 MED ORDER — VITAMIN E 180 MG (400 UNIT) PO CAPS
400.0000 [IU] | ORAL_CAPSULE | Freq: Every day | ORAL | Status: DC
Start: 2013-11-16 — End: 2015-10-22

## 2013-11-16 NOTE — Progress Notes (Signed)
Pre visit review using our clinic review tool, if applicable. No additional management support is needed unless otherwise documented below in the visit note. 

## 2013-11-16 NOTE — Assessment & Plan Note (Signed)
Labs

## 2013-11-16 NOTE — Assessment & Plan Note (Signed)
He will see Dr Toy Care

## 2013-11-16 NOTE — Patient Instructions (Signed)
Normal BP<130/85 

## 2013-11-16 NOTE — Progress Notes (Signed)
   Subjective:    HPI The patient is here for a wellness exam. The patient has been doing well overall without major physical or psychological issues going on lately.  F/u insomnia, chronic.  Stressed - family stress. He has graduated from [employer]  BP Readings from Last 3 Encounters:  11/16/13 170/108  10/11/12 130/82  09/27/12 138/90   Wt Readings from Last 3 Encounters:  11/16/13 169 lb (76.658 kg)  10/11/12 169 lb (76.658 kg)  09/27/12 168 lb (76.204 kg)      Review of Systems  Constitutional: Negative for appetite change, fatigue and unexpected weight change.  HENT: Negative for nosebleeds and trouble swallowing.   Eyes: Negative for itching and visual disturbance.  Cardiovascular: Negative for palpitations and leg swelling.  Gastrointestinal: Negative for blood in stool and abdominal distention.  Genitourinary: Negative for hematuria.  Musculoskeletal: Negative for back pain, gait problem and joint swelling.  Neurological: Negative for dizziness, tremors, facial asymmetry, speech difficulty, weakness and light-headedness.  Psychiatric/Behavioral: Positive for sleep disturbance. Negative for suicidal ideas, behavioral problems, dysphoric mood, decreased concentration and agitation. The patient is not nervous/anxious.        Stress       Objective:   Physical Exam  Constitutional: He is oriented to person, place, and time. He appears well-developed and well-nourished. No distress.  HENT:  Head: Normocephalic.  Nose: Nose normal.  Mouth/Throat: Oropharynx is clear and moist.  Eyes: Conjunctivae are normal. Pupils are equal, round, and reactive to light. Left eye exhibits no discharge. No scleral icterus.  Neck: Normal range of motion. No JVD present. No thyromegaly present.  Cardiovascular: Normal rate, regular rhythm, normal heart sounds and intact distal pulses.  Exam reveals no gallop and no friction rub.   No murmur heard. Pulmonary/Chest: Effort  normal and breath sounds normal. No respiratory distress. He has no wheezes. He has no rales. He exhibits no tenderness.  Abdominal: Soft. Bowel sounds are normal. He exhibits no distension and no mass. There is no tenderness. There is no rebound and no guarding.  Genitourinary: Penis normal. No penile tenderness.  Penile shaft with some mild palpable distal fibrosis  Musculoskeletal: Normal range of motion. He exhibits no edema and no tenderness.  Lymphadenopathy:    He has no cervical adenopathy.  Neurological: He is alert and oriented to person, place, and time. He has normal reflexes. No cranial nerve deficit. He exhibits normal muscle tone. Coordination normal.  Skin: Skin is warm and dry. No rash noted. No erythema. No pallor.  Psychiatric: His behavior is normal. Judgment and thought content normal.   Lab Results  Component Value Date   WBC 8.0 09/27/2012   HGB 12.8* 09/27/2012   HCT 37.6* 09/27/2012   PLT 236.0 09/27/2012   GLUCOSE 94 09/27/2012   CHOL 230* 09/13/2009   TRIG 144.0 09/13/2009   HDL 37.20* 09/13/2009   LDLDIRECT 171.7 09/13/2009   ALT 26 09/27/2012   AST 25 09/27/2012   NA 137 09/27/2012   K 4.4 09/27/2012   CL 102 09/27/2012   CREATININE 1.0 09/27/2012   BUN 16 09/27/2012   CO2 29 09/27/2012   TSH 1.01 11/19/2010   PSA 0.54 09/13/2009          Assessment & Plan:

## 2013-11-16 NOTE — Assessment & Plan Note (Addendum)
We discussed age appropriate health related issues, including available/recomended screening tests and vaccinations. We discussed a need for adhering to healthy diet and exercise. Labs/EKG were reviewed/ordered. All questions were answered. Colon - up to date

## 2013-11-16 NOTE — Assessment & Plan Note (Signed)
Wt Readings from Last 3 Encounters:  11/16/13 169 lb (76.658 kg)  10/11/12 169 lb (76.658 kg)  09/27/12 168 lb (76.204 kg)  resolved

## 2013-11-16 NOTE — Assessment & Plan Note (Signed)
NAS diet 

## 2013-11-17 LAB — VITAMIN D 25 HYDROXY (VIT D DEFICIENCY, FRACTURES): VIT D 25 HYDROXY: 39 ng/mL (ref 30–89)

## 2013-12-20 ENCOUNTER — Telehealth: Payer: Self-pay | Admitting: Internal Medicine

## 2013-12-20 DIAGNOSIS — R350 Frequency of micturition: Secondary | ICD-10-CM

## 2013-12-20 NOTE — Telephone Encounter (Signed)
Patient is calling to request that Dr. Alain Marion put in a referral for him to Urology. Patient says that this was suggested to him at his last OV but he refused. He now wants to move forward with referral.

## 2013-12-21 NOTE — Telephone Encounter (Signed)
Done. Thx.

## 2013-12-27 ENCOUNTER — Other Ambulatory Visit: Payer: Self-pay | Admitting: Internal Medicine

## 2014-01-10 ENCOUNTER — Ambulatory Visit (INDEPENDENT_AMBULATORY_CARE_PROVIDER_SITE_OTHER): Payer: Self-pay | Admitting: Internal Medicine

## 2014-01-10 ENCOUNTER — Encounter: Payer: Self-pay | Admitting: Internal Medicine

## 2014-01-10 VITALS — BP 146/98 | HR 66 | Temp 98.5°F | Resp 16 | Ht 70.5 in | Wt 172.0 lb

## 2014-01-10 DIAGNOSIS — F341 Dysthymic disorder: Secondary | ICD-10-CM

## 2014-01-10 DIAGNOSIS — N529 Male erectile dysfunction, unspecified: Secondary | ICD-10-CM

## 2014-01-10 DIAGNOSIS — N486 Induration penis plastica: Secondary | ICD-10-CM | POA: Insufficient documentation

## 2014-01-10 MED ORDER — TADALAFIL 20 MG PO TABS: 20.0000 mg | ORAL_TABLET | Freq: Every day | ORAL | Status: DC | PRN

## 2014-01-10 MED ORDER — SILDENAFIL CITRATE 100 MG PO TABS: 100.0000 mg | ORAL_TABLET | ORAL | Status: DC | PRN

## 2014-01-10 MED ORDER — ALPRAZOLAM 0.25 MG PO TABS: 0.1250 mg | ORAL_TABLET | Freq: Two times a day (BID) | ORAL | Status: DC | PRN

## 2014-01-10 NOTE — Assessment & Plan Note (Signed)
Will try Xanax - low dose Clonazepam caused drowsiness in am  Situational Not interested in antidepressants  Potential benefits of a long term benzodiazepines  use as well as potential risks  and complications were explained to the patient and were aknowledged.

## 2014-01-10 NOTE — Progress Notes (Signed)
Subjective:    HPI   F/u insomnia, chronic. Clonazepam is too strong.  Stressed - family stress. He has graduated from [employer]  BP Readings from Last 3 Encounters:  01/10/14 146/98  11/16/13 170/108  10/11/12 130/82   Wt Readings from Last 3 Encounters:  01/10/14 172 lb (78.019 kg)  11/16/13 169 lb (76.658 kg)  10/11/12 169 lb (76.658 kg)      Review of Systems  Constitutional: Negative for appetite change, fatigue and unexpected weight change.  HENT: Negative for nosebleeds and trouble swallowing.   Eyes: Negative for itching and visual disturbance.  Cardiovascular: Negative for palpitations and leg swelling.  Gastrointestinal: Negative for blood in stool and abdominal distention.  Genitourinary: Negative for hematuria.  Musculoskeletal: Negative for back pain, gait problem and joint swelling.  Neurological: Negative for dizziness, tremors, facial asymmetry, speech difficulty, weakness and light-headedness.  Psychiatric/Behavioral: Positive for sleep disturbance. Negative for suicidal ideas, behavioral problems, dysphoric mood, decreased concentration and agitation. The patient is not nervous/anxious.        Stress       Objective:   Physical Exam  Constitutional: He is oriented to person, place, and time. He appears well-developed and well-nourished. No distress.  HENT:  Head: Normocephalic.  Nose: Nose normal.  Mouth/Throat: Oropharynx is clear and moist.  Eyes: Conjunctivae are normal. Pupils are equal, round, and reactive to light. Left eye exhibits no discharge. No scleral icterus.  Neck: Normal range of motion. No JVD present. No thyromegaly present.  Cardiovascular: Normal rate, regular rhythm, normal heart sounds and intact distal pulses.  Exam reveals no gallop and no friction rub.   No murmur heard. Pulmonary/Chest: Effort normal and breath sounds normal. No respiratory distress. He has no wheezes. He has no rales. He exhibits no  tenderness.  Abdominal: Soft. Bowel sounds are normal. He exhibits no distension and no mass. There is no tenderness. There is no rebound and no guarding.  Genitourinary: Penis normal. No penile tenderness.  Penile shaft with some mild palpable distal fibrosis  Musculoskeletal: Normal range of motion. He exhibits no edema and no tenderness.  Lymphadenopathy:    He has no cervical adenopathy.  Neurological: He is alert and oriented to person, place, and time. He has normal reflexes. No cranial nerve deficit. He exhibits normal muscle tone. Coordination normal.  Skin: Skin is warm and dry. No rash noted. No erythema. No pallor.  Psychiatric: His behavior is normal. Judgment and thought content normal.  Wart L neck x1 3x4 mm   Lab Results  Component Value Date   WBC 6.6 11/16/2013   HGB 15.2 11/16/2013   HCT 44.1 11/16/2013   PLT 233.0 11/16/2013   GLUCOSE 102* 11/16/2013   CHOL 205* 11/16/2013   TRIG 109.0 11/16/2013   HDL 37.20* 11/16/2013   LDLDIRECT 171.7 09/13/2009   LDLCALC 146* 11/16/2013   ALT 17 11/16/2013   AST 18 11/16/2013   NA 138 11/16/2013   K 4.2 11/16/2013   CL 102 11/16/2013   CREATININE 1.1 11/16/2013   BUN 15 11/16/2013   CO2 28 11/16/2013   TSH 1.60 11/16/2013   PSA 0.75 11/16/2013    Procedure Note :     Procedure : Cryosurgery   Indication:  Wart L neck x1   Risks including unsuccessful procedure , bleeding, infection, bruising, scar, a need for a repeat  procedure and others were explained to the patient in detail as well as the benefits. Informed consent was obtained verbally.  1 lesion(s)  on  L dist neck  was/were treated with liquid nitrogen on a Q-tip in a usual fasion . Band-Aid was applied and antibiotic ointment was given for a later use.   Tolerated well. Complications none.   Postprocedure instructions :     Keep the wounds clean. You can wash them with liquid soap and water. Pat dry with gauze or a Kleenex tissue  Before applying antibiotic ointment and  a Band-Aid.   You need to report immediately  if  any signs of infection develop.           Assessment & Plan:

## 2014-01-10 NOTE — Progress Notes (Signed)
Pre visit review using our clinic review tool, if applicable. No additional management support is needed unless otherwise documented below in the visit note. 

## 2014-01-10 NOTE — Assessment & Plan Note (Signed)
Cialis prn 

## 2014-01-10 NOTE — Assessment & Plan Note (Signed)
Urol cons w/Dr Karsten Ro

## 2014-07-18 ENCOUNTER — Other Ambulatory Visit: Payer: Self-pay

## 2014-07-18 NOTE — Telephone Encounter (Signed)
PA for Cialis sent via [employer]Meds.

## 2014-07-26 NOTE — Telephone Encounter (Signed)
Ok Thx 

## 2014-07-26 NOTE — Telephone Encounter (Signed)
PA for cialis is denied.

## 2014-07-27 NOTE — Telephone Encounter (Signed)
Viagra, Staxin, Levitra Thx

## 2014-07-27 NOTE — Telephone Encounter (Signed)
Is there an alternative?

## 2014-07-30 MED ORDER — SILDENAFIL CITRATE 100 MG PO TABS: 100.0000 mg | ORAL_TABLET | ORAL | Status: DC | PRN

## 2014-07-30 NOTE — Telephone Encounter (Signed)
Viagra 100 mg 1 po qd prn  --- see Rx Thx

## 2014-07-30 NOTE — Telephone Encounter (Signed)
We can try the viagra first. What dose and strength, # and refills please?

## 2014-08-09 ENCOUNTER — Telehealth: Payer: Self-pay | Admitting: Internal Medicine

## 2014-08-09 NOTE — Telephone Encounter (Signed)
Patient is requesting compass referral to [facility] Dr. Festus Aloe.  Patient has appointment scheduled for 08/22/14 at 1:30pm.

## 2014-08-14 NOTE — Telephone Encounter (Signed)
[employer] ref # [id number] valid 08/14/14-02/12/15 for 6 visits. Auth faxed to [facility].

## 2014-08-23 ENCOUNTER — Other Ambulatory Visit (INDEPENDENT_AMBULATORY_CARE_PROVIDER_SITE_OTHER): Payer: 59

## 2014-08-23 ENCOUNTER — Ambulatory Visit (INDEPENDENT_AMBULATORY_CARE_PROVIDER_SITE_OTHER)
Admission: RE | Admit: 2014-08-23 | Discharge: 2014-08-23 | Disposition: A | Discharge status: Home / Self Care | Payer: 59 | Source: Ambulatory Visit | Attending: Internal Medicine | Admitting: Internal Medicine

## 2014-08-23 ENCOUNTER — Ambulatory Visit (INDEPENDENT_AMBULATORY_CARE_PROVIDER_SITE_OTHER): Payer: 59 | Admitting: Internal Medicine

## 2014-08-23 ENCOUNTER — Encounter: Payer: Self-pay | Admitting: Internal Medicine

## 2014-08-23 VITALS — BP 160/102 | HR 56 | Temp 97.8°F | Ht 70.5 in | Wt 175.8 lb

## 2014-08-23 DIAGNOSIS — G4700 Insomnia, unspecified: Secondary | ICD-10-CM

## 2014-08-23 DIAGNOSIS — N528 Other male erectile dysfunction: Secondary | ICD-10-CM

## 2014-08-23 DIAGNOSIS — R05 Cough: Secondary | ICD-10-CM

## 2014-08-23 DIAGNOSIS — R590 Localized enlarged lymph nodes: Secondary | ICD-10-CM

## 2014-08-23 DIAGNOSIS — IMO0001 Reserved for inherently not codable concepts without codable children: Secondary | ICD-10-CM

## 2014-08-23 DIAGNOSIS — K219 Gastro-esophageal reflux disease without esophagitis: Secondary | ICD-10-CM

## 2014-08-23 DIAGNOSIS — N529 Male erectile dysfunction, unspecified: Secondary | ICD-10-CM

## 2014-08-23 DIAGNOSIS — R030 Elevated blood-pressure reading, without diagnosis of hypertension: Secondary | ICD-10-CM

## 2014-08-23 DIAGNOSIS — N400 Enlarged prostate without lower urinary tract symptoms: Secondary | ICD-10-CM

## 2014-08-23 DIAGNOSIS — Z23 Encounter for immunization: Secondary | ICD-10-CM

## 2014-08-23 DIAGNOSIS — R059 Cough, unspecified: Secondary | ICD-10-CM

## 2014-08-23 LAB — CBC WITH DIFFERENTIAL/PLATELET
Basophils Absolute: 0 10*3/uL (ref 0.0–0.1)
Basophils Relative: 0.5 % (ref 0.0–3.0)
EOS PCT: 1.2 % (ref 0.0–5.0)
Eosinophils Absolute: 0.1 10*3/uL (ref 0.0–0.7)
HEMATOCRIT: 45 % (ref 39.0–52.0)
Hemoglobin: 15.5 g/dL (ref 13.0–17.0)
LYMPHS ABS: 1.6 10*3/uL (ref 0.7–4.0)
Lymphocytes Relative: 23.6 % (ref 12.0–46.0)
MCHC: 34.5 g/dL (ref 30.0–36.0)
MCV: 91.3 fl (ref 78.0–100.0)
MONOS PCT: 9.2 % (ref 3.0–12.0)
Monocytes Absolute: 0.6 10*3/uL (ref 0.1–1.0)
NEUTROS ABS: 4.3 10*3/uL (ref 1.4–7.7)
Neutrophils Relative %: 65.5 % (ref 43.0–77.0)
Platelets: 236 10*3/uL (ref 150.0–400.0)
RBC: 4.93 Mil/uL (ref 4.22–5.81)
RDW: 13 % (ref 11.5–15.5)
WBC: 6.6 10*3/uL (ref 4.0–10.5)

## 2014-08-23 LAB — BASIC METABOLIC PANEL
BUN: 17 mg/dL (ref 6–23)
CALCIUM: 10.2 mg/dL (ref 8.4–10.5)
CO2: 30 mEq/L (ref 19–32)
Chloride: 101 mEq/L (ref 96–112)
Creatinine, Ser: 1.13 mg/dL (ref 0.40–1.50)
GFR: 71.92 mL/min (ref >60.00)
GLUCOSE: 108 mg/dL — AB (ref 70–99)
Potassium: 4.7 mEq/L (ref 3.5–5.1)
Sodium: 136 mEq/L (ref 135–145)

## 2014-08-23 LAB — VITAMIN B12: Vitamin B-12: 509 pg/mL (ref 211–911)

## 2014-08-23 LAB — TSH: TSH: 1.08 u[IU]/mL (ref 0.35–4.50)

## 2014-08-23 MED ORDER — ALPRAZOLAM 0.5 MG PO TABS: 0.5000 mg | ORAL_TABLET | Freq: Two times a day (BID) | ORAL | Status: DC | PRN

## 2014-08-23 MED ORDER — TADALAFIL 5 MG PO TABS: 5.0000 mg | ORAL_TABLET | Freq: Every day | ORAL | Status: DC | PRN

## 2014-08-23 MED ORDER — TRAZODONE HCL 50 MG PO TABS: 25.0000 mg | ORAL_TABLET | Freq: Every evening | ORAL | Status: DC | PRN

## 2014-08-23 NOTE — Assessment & Plan Note (Signed)
On daily Cialis

## 2014-08-23 NOTE — Progress Notes (Signed)
   Subjective:    HPI   F/u insomnia, chronic. Clonazepam is too strong.  Stressed - family stress. He has graduated from [place]  BP Readings from Last 3 Encounters:  01/10/14 146/98  11/16/13 170/108  10/11/12 130/82   Wt Readings from Last 3 Encounters:  08/23/14 175 lb 12 oz (79.72 kg)  01/10/14 172 lb (78.019 kg)  11/16/13 169 lb (76.658 kg)      Review of Systems  Constitutional: Negative for appetite change, fatigue and unexpected weight change.  HENT: Negative for nosebleeds and trouble swallowing.   Eyes: Negative for itching and visual disturbance.  Cardiovascular: Negative for palpitations and leg swelling.  Gastrointestinal: Negative for blood in stool and abdominal distention.  Genitourinary: Negative for hematuria.  Musculoskeletal: Negative for back pain, joint swelling and gait problem.  Neurological: Negative for dizziness, tremors, facial asymmetry, speech difficulty, weakness and light-headedness.  Psychiatric/Behavioral: Positive for sleep disturbance. Negative for suicidal ideas, behavioral problems, dysphoric mood, decreased concentration and agitation. The patient is not nervous/anxious.        Stress       Objective:   Physical Exam  Constitutional: He is oriented to person, place, and time. He appears well-developed and well-nourished. No distress.  HENT:  Head: Normocephalic.  Nose: Nose normal.  Mouth/Throat: Oropharynx is clear and moist.  Eyes: Conjunctivae are normal. Pupils are equal, round, and reactive to light. Left eye exhibits no discharge. No scleral icterus.  Neck: Normal range of motion. No JVD present. No thyromegaly present.  Cardiovascular: Normal rate, regular rhythm, normal heart sounds and intact distal pulses.  Exam reveals no gallop and no friction rub.   No murmur heard. Pulmonary/Chest: Effort normal and breath sounds normal. No respiratory distress. He has no wheezes. He has no rales. He exhibits no  tenderness.  Abdominal: Soft. Bowel sounds are normal. He exhibits no distension and no mass. There is no tenderness. There is no rebound and no guarding.  Genitourinary: Penis normal. No penile tenderness.  Penile shaft with some mild palpable distal fibrosis  Musculoskeletal: Normal range of motion. He exhibits no edema or tenderness.  Lymphadenopathy:    He has no cervical adenopathy.  Neurological: He is alert and oriented to person, place, and time. He has normal reflexes. No cranial nerve deficit. He exhibits normal muscle tone. Coordination normal.  Skin: Skin is warm and dry. No rash noted. No erythema. No pallor.  Psychiatric: His behavior is normal. Judgment and thought content normal.  R neck oval mass   Lab Results  Component Value Date   WBC 6.6 11/16/2013   HGB 15.2 11/16/2013   HCT 44.1 11/16/2013   PLT 233.0 11/16/2013   GLUCOSE 102* 11/16/2013   CHOL 205* 11/16/2013   TRIG 109.0 11/16/2013   HDL 37.20* 11/16/2013   LDLDIRECT 171.7 09/13/2009   LDLCALC 146* 11/16/2013   ALT 17 11/16/2013   AST 18 11/16/2013   NA 138 11/16/2013   K 4.2 11/16/2013   CL 102 11/16/2013   CREATININE 1.1 11/16/2013   BUN 15 11/16/2013   CO2 28 11/16/2013   TSH 1.60 11/16/2013   PSA 0.75 11/16/2013            Assessment & Plan:

## 2014-08-23 NOTE — Assessment & Plan Note (Addendum)
Check BP at home  RTC 4 mo w/record Rx offered

## 2014-08-23 NOTE — Assessment & Plan Note (Signed)
Omeprazole

## 2014-08-23 NOTE — Assessment & Plan Note (Signed)
ENT ref 

## 2014-08-23 NOTE — Progress Notes (Signed)
Pre visit review using our clinic review tool, if applicable. No additional management support is needed unless otherwise documented below in the visit note. 

## 2014-08-23 NOTE — Assessment & Plan Note (Signed)
Added Trazodone °

## 2014-09-13 ENCOUNTER — Telehealth: Payer: Self-pay | Admitting: Internal Medicine

## 2014-09-13 MED ORDER — TADALAFIL 5 MG PO TABS: 5.0000 mg | ORAL_TABLET | Freq: Every day | ORAL | Status: DC | PRN

## 2014-09-13 NOTE — Telephone Encounter (Signed)
Cialis sent to [facility]. Please advise on Alprazolam and trazodone.

## 2014-09-13 NOTE — Telephone Encounter (Signed)
Patient misplaced the follow scripts that were printed on 08/23/2014. He requests that we send in to [facility] [address]. Please call him to advise.   ALPRAZolam (XANAX) 0.5 MG tablet [[id number]]  tadalafil (CIALIS) 5 MG tablet [[id number]]  traZODone (DESYREL) 50 MG tablet [[id number]]

## 2014-09-17 ENCOUNTER — Telehealth: Payer: Self-pay | Admitting: *Deleted

## 2014-09-17 NOTE — Telephone Encounter (Signed)
Cialis 5 mg PA is approved . Pharmacy informed.

## 2014-12-27 ENCOUNTER — Other Ambulatory Visit (INDEPENDENT_AMBULATORY_CARE_PROVIDER_SITE_OTHER): Payer: 59

## 2014-12-27 ENCOUNTER — Ambulatory Visit (INDEPENDENT_AMBULATORY_CARE_PROVIDER_SITE_OTHER): Payer: 59 | Admitting: Internal Medicine

## 2014-12-27 ENCOUNTER — Encounter: Payer: Self-pay | Admitting: Internal Medicine

## 2014-12-27 VITALS — BP 160/98 | HR 55 | Temp 98.2°F | Ht 71.0 in | Wt 175.0 lb

## 2014-12-27 DIAGNOSIS — N486 Induration penis plastica: Secondary | ICD-10-CM | POA: Diagnosis not present

## 2014-12-27 DIAGNOSIS — G4700 Insomnia, unspecified: Secondary | ICD-10-CM | POA: Diagnosis not present

## 2014-12-27 DIAGNOSIS — N529 Male erectile dysfunction, unspecified: Secondary | ICD-10-CM

## 2014-12-27 DIAGNOSIS — T148 Other injury of unspecified body region: Secondary | ICD-10-CM | POA: Diagnosis not present

## 2014-12-27 DIAGNOSIS — N528 Other male erectile dysfunction: Secondary | ICD-10-CM

## 2014-12-27 DIAGNOSIS — I10 Essential (primary) hypertension: Secondary | ICD-10-CM

## 2014-12-27 DIAGNOSIS — W57XXXA Bitten or stung by nonvenomous insect and other nonvenomous arthropods, initial encounter: Secondary | ICD-10-CM

## 2014-12-27 DIAGNOSIS — S0086XA Insect bite (nonvenomous) of other part of head, initial encounter: Secondary | ICD-10-CM

## 2014-12-27 DIAGNOSIS — T07XXXA Unspecified multiple injuries, initial encounter: Secondary | ICD-10-CM

## 2014-12-27 LAB — BASIC METABOLIC PANEL
BUN: 19 mg/dL (ref 6–23)
CHLORIDE: 102 meq/L (ref 96–112)
CO2: 28 mEq/L (ref 19–32)
Calcium: 9.9 mg/dL (ref 8.4–10.5)
Creatinine, Ser: 1.07 mg/dL (ref 0.40–1.50)
GFR: 76.5 mL/min (ref >60.00)
GLUCOSE: 96 mg/dL (ref 70–99)
POTASSIUM: 4.5 meq/L (ref 3.5–5.1)
Sodium: 138 mEq/L (ref 135–145)

## 2014-12-27 LAB — CBC WITH DIFFERENTIAL/PLATELET
Basophils Absolute: 0 10*3/uL (ref 0.0–0.1)
Basophils Relative: 0.3 % (ref 0.0–3.0)
EOS ABS: 0.1 10*3/uL (ref 0.0–0.7)
EOS PCT: 1.4 % (ref 0.0–5.0)
HCT: 44.1 % (ref 39.0–52.0)
Hemoglobin: 15.1 g/dL (ref 13.0–17.0)
LYMPHS PCT: 21.2 % (ref 12.0–46.0)
Lymphs Abs: 1.6 10*3/uL (ref 0.7–4.0)
MCHC: 34.2 g/dL (ref 30.0–36.0)
MCV: 93 fl (ref 78.0–100.0)
Monocytes Absolute: 0.6 10*3/uL (ref 0.1–1.0)
Monocytes Relative: 8.4 % (ref 3.0–12.0)
NEUTROS ABS: 5.2 10*3/uL (ref 1.4–7.7)
Neutrophils Relative %: 68.7 % (ref 43.0–77.0)
Platelets: 203 10*3/uL (ref 150.0–400.0)
RBC: 4.74 Mil/uL (ref 4.22–5.81)
RDW: 13 % (ref 11.5–15.5)
WBC: 7.6 10*3/uL (ref 4.0–10.5)

## 2014-12-27 LAB — SEDIMENTATION RATE: Sed Rate: 6 mm/hr (ref 0–22)

## 2014-12-27 LAB — TSH: TSH: 1.33 u[IU]/mL (ref 0.35–4.50)

## 2014-12-27 MED ORDER — LOSARTAN POTASSIUM 100 MG PO TABS: 100.0000 mg | ORAL_TABLET | Freq: Every day | ORAL | Status: DC

## 2014-12-27 MED ORDER — SUVOREXANT 15 MG PO TABS: 15.0000 mg | ORAL_TABLET | Freq: Every evening | ORAL | Status: DC | PRN

## 2014-12-27 NOTE — Assessment & Plan Note (Signed)
7/16 multiple Labs per pt's request

## 2014-12-27 NOTE — Assessment & Plan Note (Signed)
2015 Chronic  Dr Junious Silk On Cialis, Vit E

## 2014-12-27 NOTE — Assessment & Plan Note (Signed)
Doing well 

## 2014-12-27 NOTE — Assessment & Plan Note (Signed)
2016 Start Losartan

## 2014-12-27 NOTE — Progress Notes (Signed)
Subjective:  Patient ID: David Hinton, male    DOB: 1960-11-29  Age: 54 y.o. MRN: [id number]  CC: Follow-up   HPI  David Hinton presents for a f/u anxiety, HTN, ED. Trazodone caused problems. Pt had some wt loss. C/o multiple tick bites.  Outpatient Prescriptions Prior to Visit  Medication Sig Dispense Refill  . ALPRAZolam (XANAX) 0.5 MG tablet Take 1 tablet (0.5 mg total) by mouth 2 (two) times daily as needed for anxiety. 60 tablet 3  . omeprazole (PRILOSEC) 20 MG capsule Take 20 mg by mouth daily.     . tadalafil (CIALIS) 5 MG tablet Take 1 tablet (5 mg total) by mouth daily as needed (for BPH). 30 tablet 11  . buPROPion (WELLBUTRIN SR) 150 MG 12 hr tablet Take 1 tablet (150 mg total) by mouth 2 (two) times daily. (Patient not taking: Reported on 12/27/2014) 180 tablet 1  . traZODone (DESYREL) 50 MG tablet Take 0.5-1 tablets (25-50 mg total) by mouth at bedtime as needed for sleep. (Patient not taking: Reported on 12/27/2014) 30 tablet 5  . vitamin E (VITAMIN E) 400 UNIT capsule Take 1 capsule (400 Units total) by mouth daily. (Patient not taking: Reported on 12/27/2014) 100 capsule 3   No facility-administered medications prior to visit.    ROS Review of Systems  Constitutional: Positive for fatigue. Negative for appetite change and unexpected weight change.  HENT: Negative for congestion, nosebleeds, sneezing, sore throat and trouble swallowing.   Eyes: Negative for itching and visual disturbance.  Respiratory: Negative for cough.   Cardiovascular: Negative for chest pain, palpitations and leg swelling.  Gastrointestinal: Negative for nausea, diarrhea, blood in stool and abdominal distention.  Genitourinary: Negative for frequency and hematuria.  Musculoskeletal: Negative for back pain, joint swelling, gait problem and neck pain.  Skin: Negative for rash.  Neurological: Negative for dizziness, tremors, speech difficulty and weakness.  Psychiatric/Behavioral: Positive for  sleep disturbance. Negative for suicidal ideas, dysphoric mood and agitation. The patient is nervous/anxious.     Objective:  BP 160/98 mmHg  Pulse 55  Temp(Src) 98.2 F (36.8 C) (Oral)  Ht 5\' 11"  (1.803 m)  Wt 175 lb (79.379 kg)  BMI 24.42 kg/m2  SpO2 97%  BP Readings from Last 3 Encounters:  12/27/14 160/98  08/23/14 160/102  01/10/14 146/98    Wt Readings from Last 3 Encounters:  12/27/14 175 lb (79.379 kg)  08/23/14 175 lb 12 oz (79.72 kg)  01/10/14 172 lb (78.019 kg)    Physical Exam  Constitutional: He is oriented to person, place, and time. He appears well-developed. No distress.  NAD  HENT:  Mouth/Throat: Oropharynx is clear and moist.  Eyes: Conjunctivae are normal. Pupils are equal, round, and reactive to light.  Neck: Normal range of motion. No JVD present. No thyromegaly present.  Cardiovascular: Normal rate, regular rhythm, normal heart sounds and intact distal pulses.  Exam reveals no gallop and no friction rub.   No murmur heard. Pulmonary/Chest: Effort normal and breath sounds normal. No respiratory distress. He has no wheezes. He has no rales. He exhibits no tenderness.  Abdominal: Soft. Bowel sounds are normal. He exhibits no distension and no mass. There is no tenderness. There is no rebound and no guarding.  Musculoskeletal: Normal range of motion. He exhibits no edema or tenderness.  Lymphadenopathy:    He has no cervical adenopathy.  Neurological: He is alert and oriented to person, place, and time. He has normal reflexes. No cranial nerve deficit. He  exhibits normal muscle tone. He displays a negative Romberg sign. Coordination and gait normal.  Skin: Skin is warm and dry. No rash noted.  Psychiatric: His behavior is normal. Judgment and thought content normal.    Lab Results  Component Value Date   WBC 6.6 08/23/2014   HGB 15.5 08/23/2014   HCT 45.0 08/23/2014   PLT 236.0 08/23/2014   GLUCOSE 108* 08/23/2014   CHOL 205* 11/16/2013   TRIG  109.0 11/16/2013   HDL 37.20* 11/16/2013   LDLDIRECT 171.7 09/13/2009   LDLCALC 146* 11/16/2013   ALT 17 11/16/2013   AST 18 11/16/2013   NA 136 08/23/2014   K 4.7 08/23/2014   CL 101 08/23/2014   CREATININE 1.13 08/23/2014   BUN 17 08/23/2014   CO2 30 08/23/2014   TSH 1.08 08/23/2014   PSA 0.75 11/16/2013    Dg Chest 2 View  08/23/2014   CLINICAL DATA:  Chest pain.  EXAM: CHEST  2 VIEW  COMPARISON:  09/16/2009.  FINDINGS: Mediastinum hilar structures normal the lungs are clear. No pleural effusion or pneumothorax. Heart size normal. No acute bony abnormality.  IMPRESSION: No acute cardiopulmonary disease.   Electronically Signed   By: Marcello Moores  Register   On: 08/23/2014 09:54    Assessment & Plan:   There are no diagnoses linked to this encounter. I am having David Hinton maintain his omeprazole, buPROPion, vitamin E, traZODone, ALPRAZolam, and tadalafil.  No orders of the defined types were placed in this encounter.     Follow-up: No Follow-up on file.  Walker Kehr, MD

## 2014-12-27 NOTE — Assessment & Plan Note (Signed)
Situational Trazodone intolerant Will try Belsomra

## 2014-12-27 NOTE — Progress Notes (Signed)
Pre visit review using our clinic review tool, if applicable. No additional management support is needed unless otherwise documented below in the visit note. 

## 2014-12-28 LAB — B. BURGDORFI ANTIBODIES: B BURGDORFERI AB IGG+ IGM: 0.16 {ISR}

## 2014-12-31 LAB — ROCKY MTN SPOTTED FVR AB, IGM-BLOOD: ROCKY MTN SPOTTED FEVER, IGM: 0.33 IV

## 2015-03-18 ENCOUNTER — Other Ambulatory Visit: Payer: Self-pay | Admitting: Internal Medicine

## 2015-03-19 NOTE — Telephone Encounter (Signed)
MD ok alprazolam called pharmacy spoke with Kassie/pharmacsit gave md approval.../lmb

## 2015-04-29 ENCOUNTER — Encounter: Payer: Self-pay | Admitting: Internal Medicine

## 2015-04-29 ENCOUNTER — Ambulatory Visit (INDEPENDENT_AMBULATORY_CARE_PROVIDER_SITE_OTHER): Payer: 59 | Admitting: Internal Medicine

## 2015-04-29 VITALS — BP 190/108 | HR 55 | Wt 181.0 lb

## 2015-04-29 DIAGNOSIS — I10 Essential (primary) hypertension: Secondary | ICD-10-CM

## 2015-04-29 DIAGNOSIS — N529 Male erectile dysfunction, unspecified: Secondary | ICD-10-CM

## 2015-04-29 DIAGNOSIS — Z7189 Other specified counseling: Secondary | ICD-10-CM

## 2015-04-29 DIAGNOSIS — G4700 Insomnia, unspecified: Secondary | ICD-10-CM

## 2015-04-29 DIAGNOSIS — F341 Dysthymic disorder: Secondary | ICD-10-CM

## 2015-04-29 DIAGNOSIS — Z7184 Encounter for health counseling related to travel: Secondary | ICD-10-CM

## 2015-04-29 MED ORDER — ALPRAZOLAM 0.5 MG PO TABS: 0.5000 mg | ORAL_TABLET | Freq: Two times a day (BID) | ORAL | Status: DC | PRN

## 2015-04-29 MED ORDER — TADALAFIL 5 MG PO TABS: 5.0000 mg | ORAL_TABLET | Freq: Every day | ORAL | Status: DC | PRN

## 2015-04-29 MED ORDER — LOSARTAN POTASSIUM 100 MG PO TABS: 100.0000 mg | ORAL_TABLET | Freq: Every day | ORAL | Status: DC

## 2015-04-29 NOTE — Assessment & Plan Note (Signed)
Situational Belsomra did not help Trazodone intolerant  Potential benefits of a long term benzodiazepines  use as well as potential risks  and complications were explained to the patient and were aknowledged.

## 2015-04-29 NOTE — Progress Notes (Signed)
Pre visit review using our clinic review tool, if applicable. No additional management support is needed unless otherwise documented below in the visit note. 

## 2015-04-29 NOTE — Assessment & Plan Note (Signed)
Off Rx 

## 2015-04-29 NOTE — Assessment & Plan Note (Addendum)
Re-start Losartan 11/16 Risks associated with treatment noncompliance were discussed. Compliance was encouraged.

## 2015-04-29 NOTE — Assessment & Plan Note (Signed)
On Viagra prn 

## 2015-04-29 NOTE — Assessment & Plan Note (Signed)
11/16 Pt is going to [place] [facility] - [facility]

## 2015-04-29 NOTE — Progress Notes (Signed)
Subjective:  Patient ID: David Hinton, male    DOB: 1960-09-18  Age: 54 y.o. MRN: [id number]  CC: No chief complaint on file.   HPI DILLINGER ASTON presents for HTN, depressed mood, ED. Pt is not taking Losartan. Pt is going to [place] [facility])  Outpatient Prescriptions Prior to Visit  Medication Sig Dispense Refill  . omeprazole (PRILOSEC) 20 MG capsule Take 20 mg by mouth daily.     Marland Kitchen ALPRAZolam (XANAX) 0.5 MG tablet take 1 tablet by mouth twice a day if needed for anxiety 60 tablet 2  . tadalafil (CIALIS) 5 MG tablet Take 1 tablet (5 mg total) by mouth daily as needed (for BPH). 30 tablet 11  . Suvorexant (BELSOMRA) 15 MG TABS Take 15 mg by mouth at bedtime as needed. (Patient not taking: Reported on 04/29/2015) 30 tablet 5  . vitamin E (VITAMIN E) 400 UNIT capsule Take 1 capsule (400 Units total) by mouth daily. (Patient not taking: Reported on 04/29/2015) 100 capsule 3  . buPROPion (WELLBUTRIN SR) 150 MG 12 hr tablet Take 1 tablet (150 mg total) by mouth 2 (two) times daily. (Patient not taking: Reported on 04/29/2015) 180 tablet 1  . losartan (COZAAR) 100 MG tablet Take 1 tablet (100 mg total) by mouth daily. (Patient not taking: Reported on 04/29/2015) 90 tablet 3   No facility-administered medications prior to visit.    ROS Review of Systems  Constitutional: Negative for appetite change, fatigue and unexpected weight change.  HENT: Negative for congestion, nosebleeds, sneezing, sore throat and trouble swallowing.   Eyes: Negative for itching and visual disturbance.  Respiratory: Negative for cough.   Cardiovascular: Negative for chest pain, palpitations and leg swelling.  Gastrointestinal: Negative for nausea, diarrhea, blood in stool and abdominal distention.  Genitourinary: Negative for frequency and hematuria.  Musculoskeletal: Negative for back pain, joint swelling, gait problem and neck pain.  Skin: Negative for rash.  Neurological: Negative for dizziness, tremors,  speech difficulty and weakness.  Psychiatric/Behavioral: Negative for sleep disturbance, dysphoric mood and agitation. The patient is not nervous/anxious.     Objective:  BP 190/108 mmHg  Pulse 55  Wt 181 lb (82.101 kg)  SpO2 99%  BP Readings from Last 3 Encounters:  04/29/15 190/108  12/27/14 160/98  08/23/14 160/102    Wt Readings from Last 3 Encounters:  04/29/15 181 lb (82.101 kg)  12/27/14 175 lb (79.379 kg)  08/23/14 175 lb 12 oz (79.72 kg)    Physical Exam  Constitutional: He is oriented to person, place, and time. He appears well-developed. No distress.  NAD  HENT:  Mouth/Throat: Oropharynx is clear and moist.  Eyes: Conjunctivae are normal. Pupils are equal, round, and reactive to light.  Neck: Normal range of motion. No JVD present. No thyromegaly present.  Cardiovascular: Normal rate, regular rhythm, normal heart sounds and intact distal pulses.  Exam reveals no gallop and no friction rub.   No murmur heard. Pulmonary/Chest: Effort normal and breath sounds normal. No respiratory distress. He has no wheezes. He has no rales. He exhibits no tenderness.  Abdominal: Soft. Bowel sounds are normal. He exhibits no distension and no mass. There is no tenderness. There is no rebound and no guarding.  Musculoskeletal: Normal range of motion. He exhibits no edema or tenderness.  Lymphadenopathy:    He has no cervical adenopathy.  Neurological: He is alert and oriented to person, place, and time. He has normal reflexes. No cranial nerve deficit. He exhibits normal muscle tone. He  displays a negative Romberg sign. Coordination and gait normal.  Skin: Skin is warm and dry. No rash noted.  Psychiatric: He has a normal mood and affect. His behavior is normal. Judgment and thought content normal.    Lab Results  Component Value Date   WBC 7.6 12/27/2014   HGB 15.1 12/27/2014   HCT 44.1 12/27/2014   PLT 203.0 12/27/2014   GLUCOSE 96 12/27/2014   CHOL 205* 11/16/2013   TRIG  109.0 11/16/2013   HDL 37.20* 11/16/2013   LDLDIRECT 171.7 09/13/2009   LDLCALC 146* 11/16/2013   ALT 17 11/16/2013   AST 18 11/16/2013   NA 138 12/27/2014   K 4.5 12/27/2014   CL 102 12/27/2014   CREATININE 1.07 12/27/2014   BUN 19 12/27/2014   CO2 28 12/27/2014   TSH 1.33 12/27/2014   PSA 0.75 11/16/2013    Dg Chest 2 View  08/23/2014  CLINICAL DATA:  Chest pain. EXAM: CHEST  2 VIEW COMPARISON:  09/16/2009. FINDINGS: Mediastinum hilar structures normal the lungs are clear. No pleural effusion or pneumothorax. Heart size normal. No acute bony abnormality. IMPRESSION: No acute cardiopulmonary disease. Electronically Signed   By: Marcello Moores  Register   On: 08/23/2014 09:54    Assessment & Plan:   Diagnoses and all orders for this visit:  Essential hypertension  INSOMNIA, CHRONIC  DEPRESSION/ANXIETY  Other orders -     ALPRAZolam (XANAX) 0.5 MG tablet; Take 1 tablet (0.5 mg total) by mouth 2 (two) times daily as needed for anxiety. -     tadalafil (CIALIS) 5 MG tablet; Take 1 tablet (5 mg total) by mouth daily as needed (for BPH). -     losartan (COZAAR) 100 MG tablet; Take 1 tablet (100 mg total) by mouth daily.  I have discontinued Mr. Berhane buPROPion. I have also changed his ALPRAZolam. Additionally, I am having him maintain his omeprazole, vitamin E, Suvorexant, tadalafil, and losartan.  Meds ordered this encounter  Medications  . ALPRAZolam (XANAX) 0.5 MG tablet    Sig: Take 1 tablet (0.5 mg total) by mouth 2 (two) times daily as needed for anxiety.    Dispense:  60 tablet    Refill:  2  . tadalafil (CIALIS) 5 MG tablet    Sig: Take 1 tablet (5 mg total) by mouth daily as needed (for BPH).    Dispense:  30 tablet    Refill:  11  . losartan (COZAAR) 100 MG tablet    Sig: Take 1 tablet (100 mg total) by mouth daily.    Dispense:  30 tablet    Refill:  11     Follow-up: Return in about 3 months (around 07/30/2015) for a follow-up visit.  Walker Kehr, MD

## 2015-07-30 ENCOUNTER — Ambulatory Visit (INDEPENDENT_AMBULATORY_CARE_PROVIDER_SITE_OTHER): Payer: Self-pay | Admitting: Internal Medicine

## 2015-07-30 ENCOUNTER — Encounter: Payer: Self-pay | Admitting: Internal Medicine

## 2015-07-30 VITALS — BP 178/120 | HR 61 | Wt 181.0 lb

## 2015-07-30 DIAGNOSIS — E559 Vitamin D deficiency, unspecified: Secondary | ICD-10-CM

## 2015-07-30 DIAGNOSIS — IMO0001 Reserved for inherently not codable concepts without codable children: Secondary | ICD-10-CM

## 2015-07-30 DIAGNOSIS — R030 Elevated blood-pressure reading, without diagnosis of hypertension: Secondary | ICD-10-CM

## 2015-07-30 DIAGNOSIS — G4700 Insomnia, unspecified: Secondary | ICD-10-CM

## 2015-07-30 DIAGNOSIS — I10 Essential (primary) hypertension: Secondary | ICD-10-CM

## 2015-07-30 MED ORDER — TADALAFIL 5 MG PO TABS: 5.0000 mg | ORAL_TABLET | Freq: Every day | ORAL | Status: DC | PRN

## 2015-07-30 MED ORDER — AVANAFIL 100 MG PO TABS: ORAL_TABLET | ORAL | Status: DC

## 2015-07-30 NOTE — Assessment & Plan Note (Signed)
Vit D - restart Rx

## 2015-07-30 NOTE — Progress Notes (Signed)
Pre visit review using our clinic review tool, if applicable. No additional management support is needed unless otherwise documented below in the visit note. 

## 2015-07-30 NOTE — Patient Instructions (Signed)
Try Valerian root at bedtime 

## 2015-07-30 NOTE — Assessment & Plan Note (Signed)
Losartan 

## 2015-07-30 NOTE — Assessment & Plan Note (Signed)
Start Losartan 

## 2015-07-30 NOTE — Progress Notes (Signed)
Subjective:  Patient ID: David Hinton, male    DOB: May 16, 1961  Age: 55 y.o. MRN: [id number]  CC: No chief complaint on file.   HPI David Hinton presents for HTN f/u - not taking Losartan C/o R arm falling asleep sensation after he started yoga a month ago. Ibuprofen 800 mg did not help     Outpatient Prescriptions Prior to Visit  Medication Sig Dispense Refill  . ALPRAZolam (XANAX) 0.5 MG tablet Take 1 tablet (0.5 mg total) by mouth 2 (two) times daily as needed for anxiety. 60 tablet 2  . omeprazole (PRILOSEC) 20 MG capsule Take 20 mg by mouth daily.     . tadalafil (CIALIS) 5 MG tablet Take 1 tablet (5 mg total) by mouth daily as needed (for BPH). 30 tablet 11  . losartan (COZAAR) 100 MG tablet Take 1 tablet (100 mg total) by mouth daily. (Patient not taking: Reported on 07/30/2015) 30 tablet 11  . Suvorexant (BELSOMRA) 15 MG TABS Take 15 mg by mouth at bedtime as needed. (Patient not taking: Reported on 07/30/2015) 30 tablet 5  . vitamin E (VITAMIN E) 400 UNIT capsule Take 1 capsule (400 Units total) by mouth daily. (Patient not taking: Reported on 07/30/2015) 100 capsule 3   No facility-administered medications prior to visit.    ROS Review of Systems  Constitutional: Negative for appetite change, fatigue and unexpected weight change.  HENT: Negative for congestion, nosebleeds, sneezing, sore throat and trouble swallowing.   Eyes: Negative for itching and visual disturbance.  Respiratory: Negative for cough.   Cardiovascular: Negative for chest pain, palpitations and leg swelling.  Gastrointestinal: Negative for nausea, diarrhea, blood in stool and abdominal distention.  Genitourinary: Negative for frequency and hematuria.  Musculoskeletal: Negative for back pain, joint swelling, gait problem and neck pain.  Skin: Negative for rash.  Neurological: Negative for dizziness, tremors, speech difficulty and weakness.  Psychiatric/Behavioral: Negative for suicidal ideas, sleep  disturbance, dysphoric mood and agitation. The patient is not nervous/anxious.     Objective:  BP 178/120 mmHg  Pulse 61  Wt 181 lb (82.101 kg)  SpO2 98%  BP Readings from Last 3 Encounters:  07/30/15 178/120  04/29/15 190/108  12/27/14 160/98    Wt Readings from Last 3 Encounters:  07/30/15 181 lb (82.101 kg)  04/29/15 181 lb (82.101 kg)  12/27/14 175 lb (79.379 kg)    Physical Exam  Constitutional: He is oriented to person, place, and time. He appears well-developed. No distress.  NAD  HENT:  Mouth/Throat: Oropharynx is clear and moist. No oropharyngeal exudate.  Eyes: Conjunctivae are normal. Pupils are equal, round, and reactive to light.  Neck: Normal range of motion. No JVD present. No thyromegaly present.  Cardiovascular: Normal rate, regular rhythm, normal heart sounds and intact distal pulses.  Exam reveals no gallop and no friction rub.   No murmur heard. Pulmonary/Chest: Effort normal and breath sounds normal. No respiratory distress. He has no wheezes. He has no rales. He exhibits no tenderness.  Abdominal: Soft. Bowel sounds are normal. He exhibits no distension and no mass. There is no tenderness. There is no rebound and no guarding.  Musculoskeletal: Normal range of motion. He exhibits no edema or tenderness.  Lymphadenopathy:    He has no cervical adenopathy.  Neurological: He is alert and oriented to person, place, and time. He has normal reflexes. No cranial nerve deficit. He exhibits normal muscle tone. He displays a negative Romberg sign. Coordination and gait normal.  Skin:  Skin is warm and dry. No rash noted.  Psychiatric: He has a normal mood and affect. His behavior is normal. Judgment and thought content normal.    Lab Results  Component Value Date   WBC 7.6 12/27/2014   HGB 15.1 12/27/2014   HCT 44.1 12/27/2014   PLT 203.0 12/27/2014   GLUCOSE 96 12/27/2014   CHOL 205* 11/16/2013   TRIG 109.0 11/16/2013   HDL 37.20* 11/16/2013   LDLDIRECT  171.7 09/13/2009   LDLCALC 146* 11/16/2013   ALT 17 11/16/2013   AST 18 11/16/2013   NA 138 12/27/2014   K 4.5 12/27/2014   CL 102 12/27/2014   CREATININE 1.07 12/27/2014   BUN 19 12/27/2014   CO2 28 12/27/2014   TSH 1.33 12/27/2014   PSA 0.75 11/16/2013    Dg Chest 2 View  08/23/2014  CLINICAL DATA:  Chest pain. EXAM: CHEST  2 VIEW COMPARISON:  09/16/2009. FINDINGS: Mediastinum hilar structures normal the lungs are clear. No pleural effusion or pneumothorax. Heart size normal. No acute bony abnormality. IMPRESSION: No acute cardiopulmonary disease. Electronically Signed   By: Marcello Moores  Register   On: 08/23/2014 09:54    Assessment & Plan:   Diagnoses and all orders for this visit:  Essential hypertension  Elevated BP  Vitamin D deficiency  INSOMNIA, CHRONIC  Other orders -     Avanafil 100 MG TABS; Use prn -     tadalafil (CIALIS) 5 MG tablet; Take 1 tablet (5 mg total) by mouth daily as needed for erectile dysfunction.   I am having [name] start on Avanafil. I am also having him maintain his omeprazole, vitamin E, Suvorexant, ALPRAZolam, losartan, and tadalafil.  Meds ordered this encounter  Medications  . Avanafil 100 MG TABS    Sig: Use prn    Dispense:  12 tablet    Refill:  3  . tadalafil (CIALIS) 5 MG tablet    Sig: Take 1 tablet (5 mg total) by mouth daily as needed for erectile dysfunction.    Dispense:  30 tablet    Refill:  11    5 mg tablets given. Pt takes 10 mg.     Follow-up: Return in about 3 months (around 10/27/2015) for a follow-up visit.  Walker Kehr, MD

## 2015-07-30 NOTE — Assessment & Plan Note (Signed)
Add Valerian root

## 2015-09-04 ENCOUNTER — Telehealth: Payer: Self-pay

## 2015-09-04 NOTE — Telephone Encounter (Signed)
OK to fill this prescription with additional refills x3 Thank you!  

## 2015-09-04 NOTE — Telephone Encounter (Signed)
Recd faxed rx refill request from [facility] [address] [phone], [phone]) for xanax 0.5mg ----please advise, thanks

## 2015-09-05 ENCOUNTER — Other Ambulatory Visit: Payer: Self-pay

## 2015-09-05 MED ORDER — ALPRAZOLAM 0.5 MG PO TABS: 0.5000 mg | ORAL_TABLET | Freq: Two times a day (BID) | ORAL | Status: DC | PRN

## 2015-09-05 NOTE — Telephone Encounter (Signed)
Called in to [facility] 

## 2015-09-30 ENCOUNTER — Encounter: Payer: Self-pay | Admitting: Internal Medicine

## 2015-10-22 ENCOUNTER — Ambulatory Visit (INDEPENDENT_AMBULATORY_CARE_PROVIDER_SITE_OTHER): Payer: BLUE CROSS/BLUE SHIELD | Admitting: Internal Medicine

## 2015-10-22 ENCOUNTER — Encounter: Payer: Self-pay | Admitting: Internal Medicine

## 2015-10-22 VITALS — BP 142/100 | HR 61 | Temp 97.5°F | Ht 71.0 in | Wt 172.0 lb

## 2015-10-22 DIAGNOSIS — Z658 Other specified problems related to psychosocial circumstances: Secondary | ICD-10-CM

## 2015-10-22 DIAGNOSIS — I10 Essential (primary) hypertension: Secondary | ICD-10-CM

## 2015-10-22 DIAGNOSIS — R030 Elevated blood-pressure reading, without diagnosis of hypertension: Secondary | ICD-10-CM | POA: Diagnosis not present

## 2015-10-22 DIAGNOSIS — IMO0001 Reserved for inherently not codable concepts without codable children: Secondary | ICD-10-CM

## 2015-10-22 DIAGNOSIS — F411 Generalized anxiety disorder: Secondary | ICD-10-CM

## 2015-10-22 DIAGNOSIS — F341 Dysthymic disorder: Secondary | ICD-10-CM | POA: Diagnosis not present

## 2015-10-22 DIAGNOSIS — G4700 Insomnia, unspecified: Secondary | ICD-10-CM

## 2015-10-22 DIAGNOSIS — F439 Reaction to severe stress, unspecified: Secondary | ICD-10-CM

## 2015-10-22 MED ORDER — SUVOREXANT 20 MG PO TABS: 20.0000 mg | ORAL_TABLET | Freq: Every evening | ORAL | Status: DC | PRN

## 2015-10-22 MED ORDER — ALPRAZOLAM 0.5 MG PO TABS: 0.5000 mg | ORAL_TABLET | Freq: Two times a day (BID) | ORAL | Status: DC | PRN

## 2015-10-22 MED ORDER — IRBESARTAN 300 MG PO TABS: 300.0000 mg | ORAL_TABLET | Freq: Every day | ORAL | Status: DC

## 2015-10-22 NOTE — Assessment & Plan Note (Signed)
Taking Losartan x 2 mo -- BP is better

## 2015-10-22 NOTE — Assessment & Plan Note (Signed)
Situational Not interested in antidepressants  Potential benefits of a long term benzodiazepines  use as well as potential risks  and complications were explained to the patient and were aknowledged.

## 2015-10-22 NOTE — Assessment & Plan Note (Signed)
Chronic  Xanax prn  Potential benefits of a long term benzodiazepines  use as well as potential risks  and complications were explained to the patient and were aknowledged. 

## 2015-10-22 NOTE — Assessment & Plan Note (Signed)
Will try Belsomra 20 mg qhs

## 2015-10-22 NOTE — Assessment & Plan Note (Signed)
d/c Losartan, start Irbesartan

## 2015-10-22 NOTE — Assessment & Plan Note (Signed)
Discussed.

## 2015-10-22 NOTE — Progress Notes (Signed)
Pre visit review using our clinic review tool, if applicable. No additional management support is needed unless otherwise documented below in the visit note. 

## 2015-10-22 NOTE — Progress Notes (Signed)
Subjective:  Patient ID: David Hinton, male    DOB: [birth year]  Age: 55 y.o. MRN: [id number]  CC: No chief complaint on file.   HPI SHAKI LADEWIG presents for anxiety, stress, GERD. C/o insomnia  Outpatient Prescriptions Prior to Visit  Medication Sig Dispense Refill  . ALPRAZolam (XANAX) 0.5 MG tablet Take 1 tablet (0.5 mg total) by mouth 2 (two) times daily as needed for anxiety. 60 tablet 3  . losartan (COZAAR) 100 MG tablet Take 1 tablet (100 mg total) by mouth daily. 30 tablet 11  . omeprazole (PRILOSEC) 20 MG capsule Take 20 mg by mouth daily.     . Avanafil 100 MG TABS Use prn (Patient not taking: Reported on 10/22/2015) 12 tablet 3  . tadalafil (CIALIS) 5 MG tablet Take 1 tablet (5 mg total) by mouth daily as needed for erectile dysfunction. 30 tablet 11  . Suvorexant (BELSOMRA) 15 MG TABS Take 15 mg by mouth at bedtime as needed. (Patient not taking: Reported on 07/30/2015) 30 tablet 5  . vitamin E (VITAMIN E) 400 UNIT capsule Take 1 capsule (400 Units total) by mouth daily. (Patient not taking: Reported on 07/30/2015) 100 capsule 3   No facility-administered medications prior to visit.    ROS Review of Systems  Constitutional: Negative for appetite change, fatigue and unexpected weight change.  HENT: Negative for congestion, nosebleeds, sneezing, sore throat and trouble swallowing.   Eyes: Negative for itching and visual disturbance.  Respiratory: Negative for cough.   Cardiovascular: Negative for chest pain, palpitations and leg swelling.  Gastrointestinal: Negative for nausea, diarrhea, blood in stool and abdominal distention.  Genitourinary: Negative for frequency and hematuria.  Musculoskeletal: Negative for back pain, joint swelling, gait problem and neck pain.  Skin: Negative for rash.  Neurological: Negative for dizziness, tremors, speech difficulty and weakness.  Psychiatric/Behavioral: Positive for sleep disturbance and decreased concentration. Negative for  suicidal ideas, dysphoric mood and agitation. The patient is nervous/anxious.     Objective:  BP 142/100 mmHg  Pulse 61  Temp(Src) 97.5 F (36.4 C) (Oral)  Ht 5\' 11"  (1.803 m)  Wt 172 lb (78.019 kg)  BMI 24.00 kg/m2  SpO2 98%  BP Readings from Last 3 Encounters:  10/22/15 142/100  07/30/15 178/120  04/29/15 190/108    Wt Readings from Last 3 Encounters:  10/22/15 172 lb (78.019 kg)  07/30/15 181 lb (82.101 kg)  04/29/15 181 lb (82.101 kg)    Physical Exam  Constitutional: He is oriented to person, place, and time. He appears well-developed. No distress.  NAD  HENT:  Mouth/Throat: Oropharynx is clear and moist.  Eyes: Conjunctivae are normal. Pupils are equal, round, and reactive to light.  Neck: Normal range of motion. No JVD present. No thyromegaly present.  Cardiovascular: Normal rate, regular rhythm, normal heart sounds and intact distal pulses.  Exam reveals no gallop and no friction rub.   No murmur heard. Pulmonary/Chest: Effort normal and breath sounds normal. No respiratory distress. He has no wheezes. He has no rales. He exhibits no tenderness.  Abdominal: Soft. Bowel sounds are normal. He exhibits no distension and no mass. There is no tenderness. There is no rebound and no guarding.  Musculoskeletal: Normal range of motion. He exhibits no edema or tenderness.  Lymphadenopathy:    He has no cervical adenopathy.  Neurological: He is alert and oriented to person, place, and time. He has normal reflexes. No cranial nerve deficit. He exhibits normal muscle tone. He displays a negative Romberg  sign. Coordination and gait normal.  Skin: Skin is warm and dry. No rash noted.  Psychiatric: He has a normal mood and affect. His behavior is normal. Judgment and thought content normal.    Lab Results  Component Value Date   WBC 7.6 12/27/2014   HGB 15.1 12/27/2014   HCT 44.1 12/27/2014   PLT 203.0 12/27/2014   GLUCOSE 96 12/27/2014   CHOL 205* 11/16/2013   TRIG 109.0  11/16/2013   HDL 37.20* 11/16/2013   LDLDIRECT 171.7 09/13/2009   LDLCALC 146* 11/16/2013   ALT 17 11/16/2013   AST 18 11/16/2013   NA 138 12/27/2014   K 4.5 12/27/2014   CL 102 12/27/2014   CREATININE 1.07 12/27/2014   BUN 19 12/27/2014   CO2 28 12/27/2014   TSH 1.33 12/27/2014   PSA 0.75 11/16/2013    Dg Chest 2 View  08/23/2014  CLINICAL DATA:  Chest pain. EXAM: CHEST  2 VIEW COMPARISON:  09/16/2009. FINDINGS: Mediastinum hilar structures normal the lungs are clear. No pleural effusion or pneumothorax. Heart size normal. No acute bony abnormality. IMPRESSION: No acute cardiopulmonary disease. Electronically Signed   By: Marcello Moores  Register   On: 08/23/2014 09:54    Assessment & Plan:   There are no diagnoses linked to this encounter. I have discontinued Mr. Horman vitamin E and Suvorexant. I am also having him maintain his omeprazole, losartan, Avanafil, tadalafil, and ALPRAZolam.  No orders of the defined types were placed in this encounter.     Follow-up: No Follow-up on file.  Walker Kehr, MD

## 2016-03-09 ENCOUNTER — Other Ambulatory Visit: Payer: Self-pay | Admitting: Internal Medicine

## 2016-03-11 NOTE — Telephone Encounter (Signed)
Called refill into pharmacy spke w/Cassie gave MD approval.../lmb

## 2016-06-25 IMAGING — CR DG CHEST 2V
2 series · 2 of 2 positions shown · non-contrast
Comparison: 09/16/2009.

CLINICAL DATA: Chest pain.

EXAM:
CHEST  2 VIEW

[view not recorded (1 of 2)]
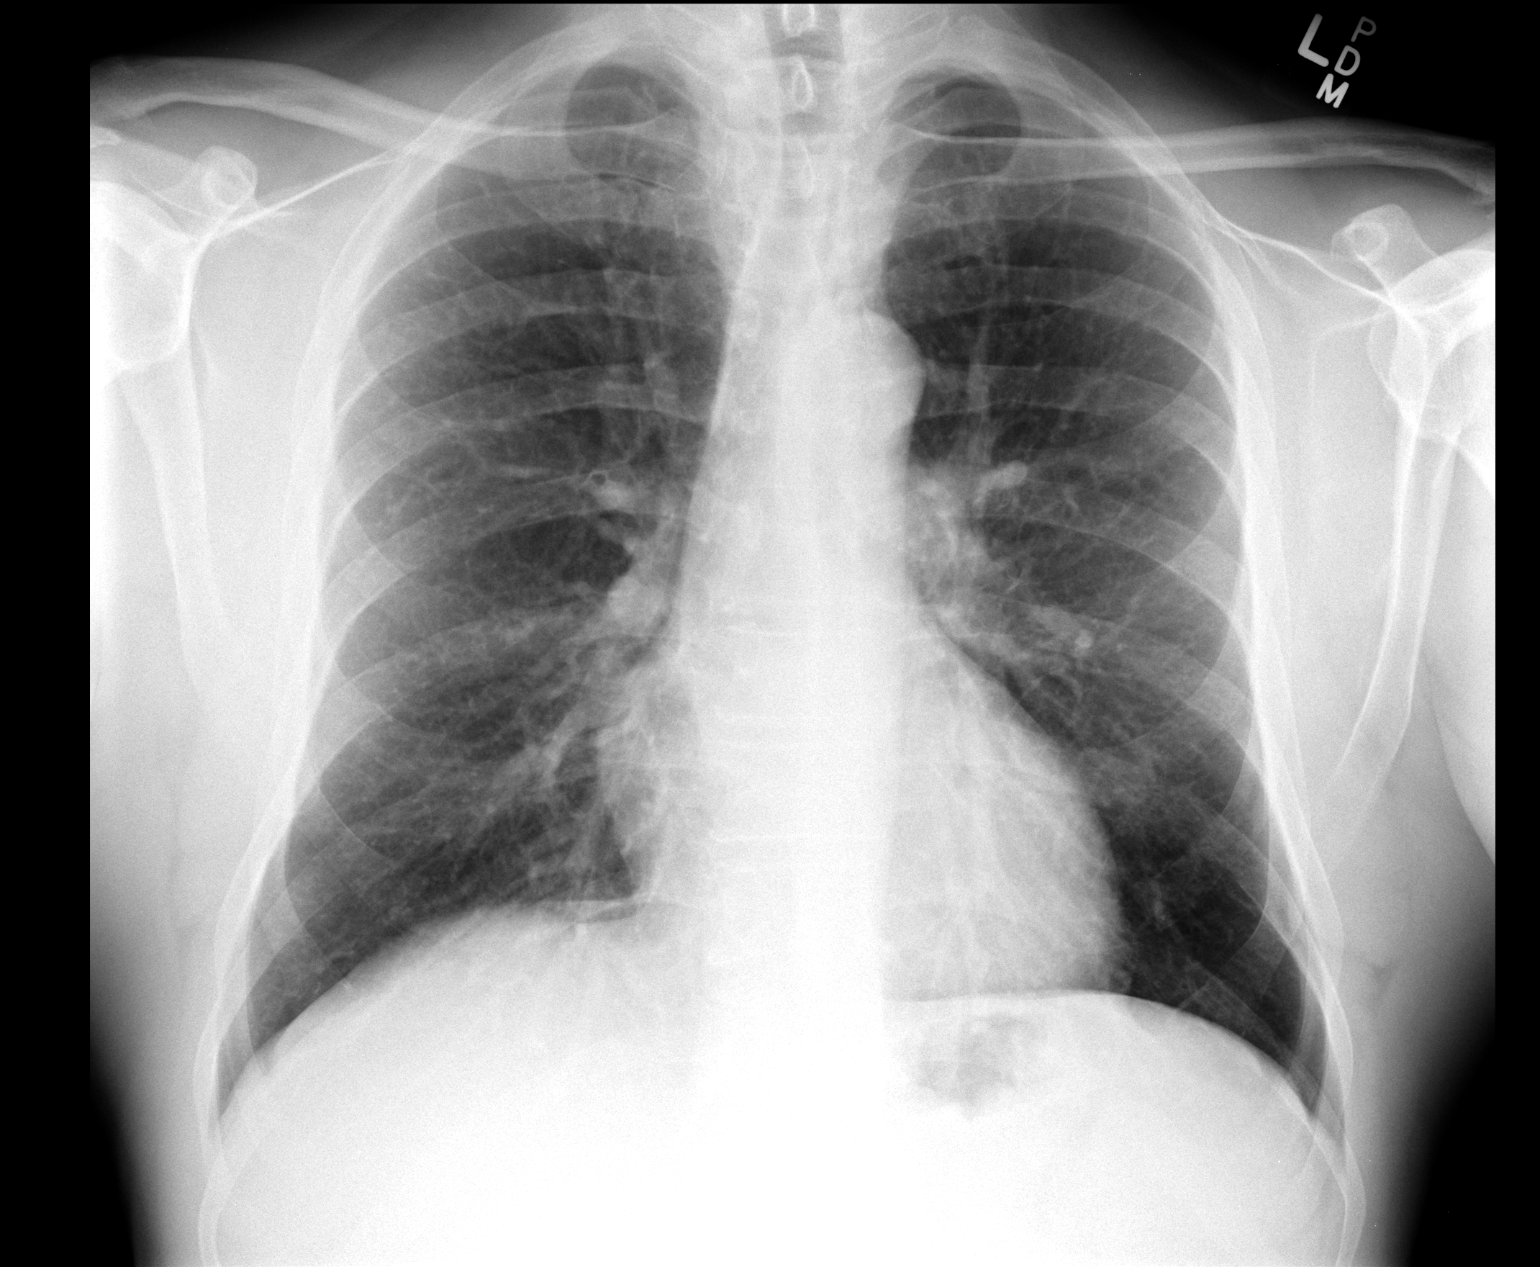

[view not recorded (2 of 2)]
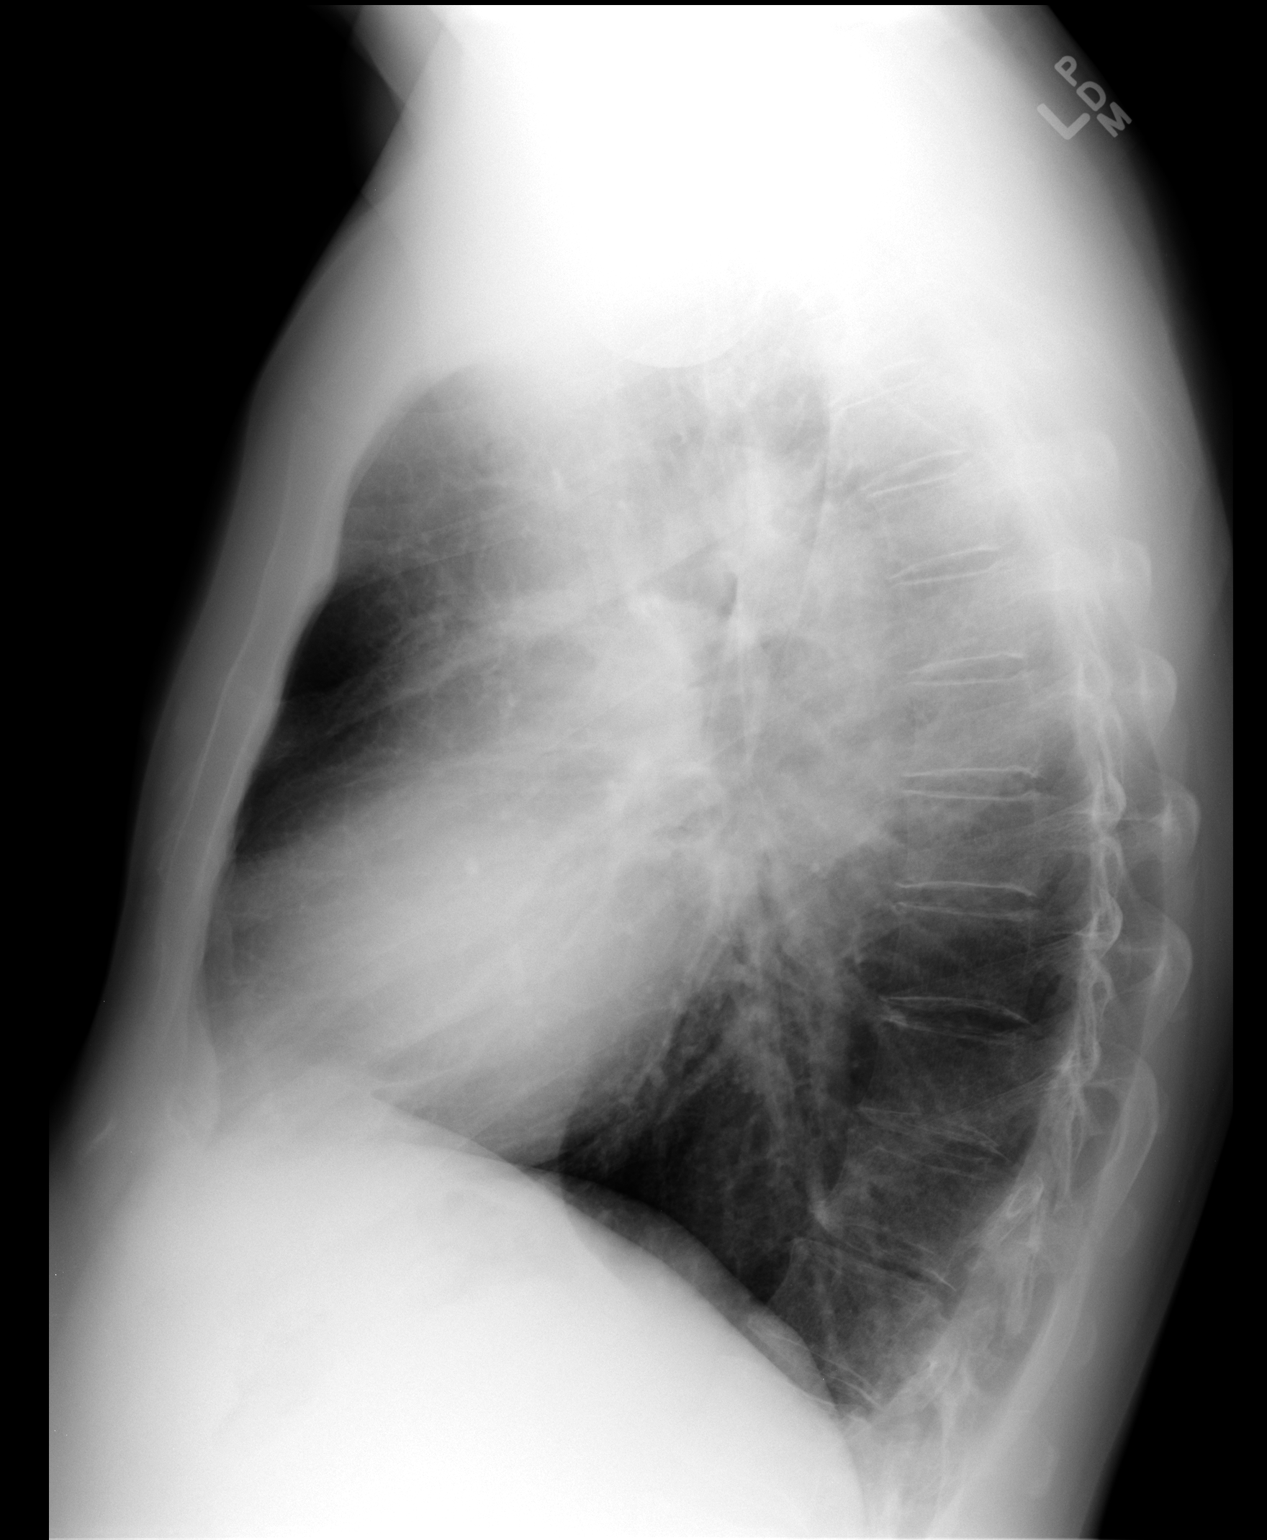

[2 of 2 positions shown; findings below may reference images not displayed]

FINDINGS: Mediastinum hilar structures normal the lungs are clear. No pleural
effusion or pneumothorax. Heart size normal. No acute bony
abnormality.
IMPRESSION: No acute cardiopulmonary disease.

## 2016-07-22 ENCOUNTER — Other Ambulatory Visit: Payer: Self-pay | Admitting: Internal Medicine

## 2016-08-25 ENCOUNTER — Other Ambulatory Visit: Payer: Self-pay | Admitting: Internal Medicine

## 2016-08-26 NOTE — Telephone Encounter (Signed)
ov

## 2016-08-26 NOTE — Telephone Encounter (Signed)
Called refill into pharmacy had to leave on pharmacist vm.../lmb 

## 2016-10-29 ENCOUNTER — Other Ambulatory Visit: Payer: Self-pay | Admitting: Internal Medicine

## 2016-10-29 NOTE — Telephone Encounter (Signed)
Pt needs PA for irbesartan (AVAPRO) 300 MG tablet   Would also refill of Xanax  [facility] on [address]

## 2016-11-02 MED ORDER — IRBESARTAN 300 MG PO TABS
300.0000 mg | ORAL_TABLET | Freq: Every day | ORAL | 0 refills | Status: DC
Start: 2016-11-02 — End: 2017-01-06

## 2016-11-02 NOTE — Telephone Encounter (Signed)
Avapro 30d sent in.Pt appt is scheduled for 11/23/16.

## 2016-11-03 MED ORDER — ALPRAZOLAM 0.5 MG PO TABS: 0.5000 mg | ORAL_TABLET | Freq: Two times a day (BID) | ORAL | 1 refills | Status: DC | PRN

## 2016-11-03 NOTE — Telephone Encounter (Signed)
Pa started:Key: [id number]  Xanax faxed

## 2016-11-05 ENCOUNTER — Telehealth: Payer: Self-pay | Admitting: Internal Medicine

## 2016-11-05 MED ORDER — TADALAFIL 5 MG PO TABS: 5.0000 mg | ORAL_TABLET | Freq: Every day | ORAL | 3 refills | Status: DC | PRN

## 2016-11-05 NOTE — Telephone Encounter (Signed)
Patient is requesting refill on Cialis.  Patient use [facility] on [address].  Is going out of town next week and would like to have medication.  Can another provider send in if [facility] not able to send?  Please follow back up with patient in regard at [phone].

## 2016-11-05 NOTE — Telephone Encounter (Signed)
RX sent, pt notified  

## 2016-11-05 NOTE — Telephone Encounter (Signed)
PA approved  Patient now has [place]  Member [id number] Group# [id number] BIN# [id number]

## 2016-11-23 ENCOUNTER — Ambulatory Visit (INDEPENDENT_AMBULATORY_CARE_PROVIDER_SITE_OTHER): Payer: BLUE CROSS/BLUE SHIELD | Admitting: Internal Medicine

## 2016-11-23 ENCOUNTER — Other Ambulatory Visit (INDEPENDENT_AMBULATORY_CARE_PROVIDER_SITE_OTHER): Payer: BLUE CROSS/BLUE SHIELD

## 2016-11-23 ENCOUNTER — Encounter: Payer: Self-pay | Admitting: Internal Medicine

## 2016-11-23 VITALS — BP 132/88 | HR 79 | Temp 98.7°F | Ht 71.0 in | Wt 177.0 lb

## 2016-11-23 DIAGNOSIS — Z0000 Encounter for general adult medical examination without abnormal findings: Secondary | ICD-10-CM

## 2016-11-23 DIAGNOSIS — R197 Diarrhea, unspecified: Secondary | ICD-10-CM | POA: Diagnosis not present

## 2016-11-23 DIAGNOSIS — J069 Acute upper respiratory infection, unspecified: Secondary | ICD-10-CM

## 2016-11-23 DIAGNOSIS — D485 Neoplasm of uncertain behavior of skin: Secondary | ICD-10-CM | POA: Diagnosis not present

## 2016-11-23 DIAGNOSIS — F439 Reaction to severe stress, unspecified: Secondary | ICD-10-CM | POA: Diagnosis not present

## 2016-11-23 DIAGNOSIS — F411 Generalized anxiety disorder: Secondary | ICD-10-CM

## 2016-11-23 DIAGNOSIS — G4700 Insomnia, unspecified: Secondary | ICD-10-CM

## 2016-11-23 LAB — BASIC METABOLIC PANEL
BUN: 20 mg/dL (ref 6–23)
CHLORIDE: 106 meq/L (ref 96–112)
CO2: 25 mEq/L (ref 19–32)
CREATININE: 1.17 mg/dL (ref 0.40–1.50)
Calcium: 10 mg/dL (ref 8.4–10.5)
GFR: 68.52 mL/min (ref >60.00)
Glucose, Bld: 84 mg/dL (ref 70–99)
Potassium: 4.2 mEq/L (ref 3.5–5.1)
Sodium: 139 mEq/L (ref 135–145)

## 2016-11-23 LAB — URINALYSIS
Bilirubin Urine: NEGATIVE
KETONES UR: NEGATIVE
LEUKOCYTES UA: NEGATIVE
Nitrite: NEGATIVE
PH: 5.5 (ref 5.0–8.0)
Urine Glucose: NEGATIVE
Urobilinogen, UA: 0.2 (ref 0.0–1.0)

## 2016-11-23 LAB — CBC WITH DIFFERENTIAL/PLATELET
Basophils Absolute: 0 10*3/uL (ref 0.0–0.1)
Basophils Relative: 0.4 % (ref 0.0–3.0)
EOS ABS: 0.1 10*3/uL (ref 0.0–0.7)
Eosinophils Relative: 1.1 % (ref 0.0–5.0)
HEMATOCRIT: 43.8 % (ref 39.0–52.0)
HEMOGLOBIN: 15.1 g/dL (ref 13.0–17.0)
LYMPHS PCT: 19.5 % (ref 12.0–46.0)
Lymphs Abs: 1.1 10*3/uL (ref 0.7–4.0)
MCHC: 34.5 g/dL (ref 30.0–36.0)
MCV: 94.1 fl (ref 78.0–100.0)
MONO ABS: 1.1 10*3/uL — AB (ref 0.1–1.0)
Monocytes Relative: 18.7 % — ABNORMAL HIGH (ref 3.0–12.0)
Neutro Abs: 3.5 10*3/uL (ref 1.4–7.7)
Neutrophils Relative %: 60.3 % (ref 43.0–77.0)
Platelets: 241 10*3/uL (ref 150.0–400.0)
RBC: 4.66 Mil/uL (ref 4.22–5.81)
RDW: 13 % (ref 11.5–15.5)
WBC: 5.8 10*3/uL (ref 4.0–10.5)

## 2016-11-23 LAB — HEPATIC FUNCTION PANEL
ALT: 16 U/L (ref 0–53)
AST: 19 U/L (ref 0–37)
Albumin: 4.8 g/dL (ref 3.5–5.2)
Alkaline Phosphatase: 77 U/L (ref 39–117)
BILIRUBIN TOTAL: 0.4 mg/dL (ref 0.2–1.2)
Bilirubin, Direct: 0.1 mg/dL (ref 0.0–0.3)
TOTAL PROTEIN: 7.7 g/dL (ref 6.0–8.3)

## 2016-11-23 LAB — LDL CHOLESTEROL, DIRECT: Direct LDL: 88 mg/dL

## 2016-11-23 LAB — PSA: PSA: 0.5 ng/mL (ref 0.10–4.00)

## 2016-11-23 LAB — TESTOSTERONE: TESTOSTERONE: 186.77 ng/dL — AB (ref 300.00–890.00)

## 2016-11-23 LAB — LIPID PANEL
CHOL/HDL RATIO: 5
Cholesterol: 176 mg/dL (ref 0–200)
HDL: 34.1 mg/dL — ABNORMAL LOW (ref >39.00)
NONHDL: 141.47
TRIGLYCERIDES: 366 mg/dL — AB (ref 0.0–149.0)
VLDL: 73.2 mg/dL — ABNORMAL HIGH (ref 0.0–40.0)

## 2016-11-23 LAB — SEDIMENTATION RATE: Sed Rate: 12 mm/hr (ref 0–20)

## 2016-11-23 MED ORDER — QUETIAPINE FUMARATE 25 MG PO TABS: 25.0000 mg | ORAL_TABLET | Freq: Every day | ORAL | 5 refills | Status: DC

## 2016-11-23 NOTE — Progress Notes (Signed)
Subjective:  Patient ID: David Hinton, male    DOB: May 17, 1961  Age: 56 y.o. MRN: [id number]  CC: No chief complaint on file.   HPI David Hinton presents for well exam. C/o insomnia: can't sleep. Problems w/GF, job... C/o diarrhea after a cruise to [place] C/o stress C/o skin lesions  Outpatient Medications Prior to Visit  Medication Sig Dispense Refill  . ALPRAZolam (XANAX) 0.5 MG tablet Take 1 tablet (0.5 mg total) by mouth 2 (two) times daily as needed for anxiety. 60 tablet 1  . irbesartan (AVAPRO) 300 MG tablet Take 1 tablet (300 mg total) by mouth daily. Annual appt is due must see MD for refills 30 tablet 0  . omeprazole (PRILOSEC) 20 MG capsule Take 20 mg by mouth daily.     . tadalafil (CIALIS) 5 MG tablet Take 1 tablet (5 mg total) by mouth daily as needed (BPH). 30 tablet 3  . Suvorexant (BELSOMRA) 20 MG TABS Take 20 mg by mouth at bedtime as needed. (Patient not taking: Reported on 11/23/2016) 30 tablet 5  . Avanafil 100 MG TABS Use prn (Patient not taking: Reported on 10/22/2015) 12 tablet 3   No facility-administered medications prior to visit.     ROS Review of Systems  Constitutional: Negative for appetite change, fatigue and unexpected weight change.  HENT: Negative for congestion, nosebleeds, sneezing, sore throat and trouble swallowing.   Eyes: Negative for itching and visual disturbance.  Respiratory: Negative for cough.   Cardiovascular: Negative for chest pain, palpitations and leg swelling.  Gastrointestinal: Negative for abdominal distention, blood in stool, diarrhea and nausea.  Genitourinary: Negative for frequency and hematuria.  Musculoskeletal: Negative for back pain, gait problem, joint swelling and neck pain.  Skin: Negative for rash.  Neurological: Negative for dizziness, tremors, speech difficulty and weakness.  Psychiatric/Behavioral: Positive for dysphoric mood. Negative for agitation and sleep disturbance. The patient is nervous/anxious.      Objective:  BP 132/88 (BP Location: Left Arm, Patient Position: Sitting, Cuff Size: Normal)   Pulse 79   Temp 98.7 F (37.1 C) (Oral)   Ht 5\' 11"  (1.803 m)   Wt 177 lb (80.3 kg)   SpO2 99%   BMI 24.69 kg/m   BP Readings from Last 3 Encounters:  11/23/16 132/88  10/22/15 (!) 142/100  07/30/15 (!) 178/120    Wt Readings from Last 3 Encounters:  11/23/16 177 lb (80.3 kg)  10/22/15 172 lb (78 kg)  07/30/15 181 lb (82.1 kg)    Physical Exam  Constitutional: He is oriented to person, place, and time. He appears well-developed. No distress.  NAD  HENT:  Mouth/Throat: Oropharynx is clear and moist.  Eyes: Conjunctivae are normal. Pupils are equal, round, and reactive to light.  Neck: Normal range of motion. No JVD present. No thyromegaly present.  Cardiovascular: Normal rate, regular rhythm, normal heart sounds and intact distal pulses.  Exam reveals no gallop and no friction rub.   No murmur heard. Pulmonary/Chest: Effort normal and breath sounds normal. No respiratory distress. He has no wheezes. He has no rales. He exhibits no tenderness.  Abdominal: Soft. Bowel sounds are normal. He exhibits no distension and no mass. There is no tenderness. There is no rebound and no guarding.  Musculoskeletal: Normal range of motion. He exhibits no edema or tenderness.  Lymphadenopathy:    He has no cervical adenopathy.  Neurological: He is alert and oriented to person, place, and time. He has normal reflexes. No cranial nerve deficit.  He exhibits normal muscle tone. He displays a negative Romberg sign. Coordination and gait normal.  Skin: Skin is warm and dry. No rash noted.  Psychiatric: His behavior is normal. Judgment and thought content normal.    Lab Results  Component Value Date   WBC 7.6 12/27/2014   HGB 15.1 12/27/2014   HCT 44.1 12/27/2014   PLT 203.0 12/27/2014   GLUCOSE 96 12/27/2014   CHOL 205 (H) 11/16/2013   TRIG 109.0 11/16/2013   HDL 37.20 (L) 11/16/2013    LDLDIRECT 171.7 09/13/2009   LDLCALC 146 (H) 11/16/2013   ALT 17 11/16/2013   AST 18 11/16/2013   NA 138 12/27/2014   K 4.5 12/27/2014   CL 102 12/27/2014   CREATININE 1.07 12/27/2014   BUN 19 12/27/2014   CO2 28 12/27/2014   TSH 1.33 12/27/2014   PSA 0.75 11/16/2013    Dg Chest 2 View  Result Date: 08/23/2014 CLINICAL DATA:  Chest pain. EXAM: CHEST  2 VIEW COMPARISON:  09/16/2009. FINDINGS: Mediastinum hilar structures normal the lungs are clear. No pleural effusion or pneumothorax. Heart size normal. No acute bony abnormality. IMPRESSION: No acute cardiopulmonary disease. Electronically Signed   By: Marcello Moores  Register   On: 08/23/2014 09:54    Assessment & Plan:   There are no diagnoses linked to this encounter. I have discontinued Mr. Eslick Avanafil. I am also having him maintain his omeprazole, Suvorexant, ALPRAZolam, irbesartan, and tadalafil.  No orders of the defined types were placed in this encounter.    Follow-up: No Follow-up on file.  Walker Kehr, MD

## 2016-11-23 NOTE — Assessment & Plan Note (Signed)
We discussed age appropriate health related issues, including available/recomended screening tests and vaccinations. We discussed a need for adhering to healthy diet and exercise. Labs were ordered to be later reviewed . All questions were answered.   

## 2016-11-23 NOTE — Assessment & Plan Note (Signed)
Derm ref

## 2016-11-23 NOTE — Assessment & Plan Note (Signed)
Worse Seroquel low dose

## 2016-11-23 NOTE — Assessment & Plan Note (Signed)
Worse after a trip to [place] Labs GI appt if not better

## 2016-11-23 NOTE — Assessment & Plan Note (Signed)
Chronic. 

## 2016-11-23 NOTE — Assessment & Plan Note (Signed)
Will ref to psychiatry

## 2016-11-24 LAB — TSH: TSH: 3.19 u[IU]/mL (ref 0.35–4.50)

## 2016-11-27 ENCOUNTER — Ambulatory Visit (INDEPENDENT_AMBULATORY_CARE_PROVIDER_SITE_OTHER)
Admission: RE | Admit: 2016-11-27 | Discharge: 2016-11-27 | Disposition: A | Discharge status: Home / Self Care | Payer: BLUE CROSS/BLUE SHIELD | Source: Ambulatory Visit | Attending: Internal Medicine | Admitting: Internal Medicine

## 2016-11-27 DIAGNOSIS — J069 Acute upper respiratory infection, unspecified: Secondary | ICD-10-CM | POA: Diagnosis not present

## 2016-11-27 DIAGNOSIS — R05 Cough: Secondary | ICD-10-CM | POA: Diagnosis not present

## 2016-12-18 ENCOUNTER — Ambulatory Visit (INDEPENDENT_AMBULATORY_CARE_PROVIDER_SITE_OTHER): Payer: BLUE CROSS/BLUE SHIELD | Admitting: Nurse Practitioner

## 2016-12-18 ENCOUNTER — Encounter: Payer: Self-pay | Admitting: Nurse Practitioner

## 2016-12-18 VITALS — BP 148/100 | HR 86 | Temp 98.2°F | Ht 71.0 in | Wt 184.0 lb

## 2016-12-18 DIAGNOSIS — M5030 Other cervical disc degeneration, unspecified cervical region: Secondary | ICD-10-CM | POA: Diagnosis not present

## 2016-12-18 DIAGNOSIS — M4722 Other spondylosis with radiculopathy, cervical region: Secondary | ICD-10-CM | POA: Diagnosis not present

## 2016-12-18 MED ORDER — PREDNISONE 10 MG (21) PO TBPK: ORAL_TABLET | ORAL | 0 refills | Status: DC

## 2016-12-18 MED ORDER — METHOCARBAMOL 750 MG PO TABS: 750.0000 mg | ORAL_TABLET | Freq: Three times a day (TID) | ORAL | 0 refills | Status: DC | PRN

## 2016-12-18 NOTE — Patient Instructions (Signed)
Go to basement for neck X-ray.  Do neck and shoulder exercise once a day  Alternate between warm and cold compress as needed.   Cervical Radiculopathy Cervical radiculopathy means that a nerve in the neck is pinched or bruised. This can cause pain or loss of feeling (numbness) that runs from your neck to your arm and fingers. Follow these instructions at home: Managing pain  Take over-the-counter and prescription medicines only as told by your doctor.  If directed, put ice on the injured or painful area. ? Put ice in a plastic bag. ? Place a towel between your skin and the bag. ? Leave the ice on for 20 minutes, 2-3 times per day.  If ice does not help, you can try using heat. Take a warm shower or warm bath, or use a heat pack as told by your doctor.  You may try a gentle neck and shoulder massage. Activity  Rest as needed. Follow instructions from your doctor about any activities to avoid.  Do exercises as told by your doctor or physical therapist. General instructions  If you were given a soft collar, wear it as told by your doctor.  Use a flat pillow when you sleep.  Keep all follow-up visits as told by your doctor. This is important. Contact a doctor if:  Your condition does not improve with treatment. Get help right away if:  Your pain gets worse and is not controlled with medicine.  You lose feeling or feel weak in your hand, arm, face, or leg.  You have a fever.  You have a stiff neck.  You cannot control when you poop or pee (have incontinence).  You have trouble with walking, balance, or talking. This information is not intended to replace advice given to you by your health care provider. Make sure you discuss any questions you have with your health care provider. Document Released: 05/28/2011 Document Revised: 11/14/2015 Document Reviewed: 08/02/2014 Elsevier Interactive Patient Education  2018 [employer].   Neck Exercises Neck exercises can be  important for many reasons:  They can help you to improve and maintain flexibility in your neck. This can be especially important as you age.  They can help to make your neck stronger. This can make movement easier.  They can reduce or prevent neck pain.  They may help your upper back.  Ask your health care provider which neck exercises would be best for you. Exercises Neck Press Repeat this exercise 10 times. Do it first thing in the morning and right before bed or as told by your health care provider. 1. Lie on your back on a firm bed or on the floor with a pillow under your head. 2. Use your neck muscles to push your head down on the pillow and straighten your spine. 3. Hold the position as well as you can. Keep your head facing up and your chin tucked. 4. Slowly count to 5 while holding this position. 5. Relax for a few seconds. Then repeat.  Isometric Strengthening Do a full set of these exercises 2 times a day or as told by your health care provider. 1. Sit in a supportive chair and place your hand on your forehead. 2. Push forward with your head and neck while pushing back with your hand. Hold for 10 seconds. 3. Relax. Then repeat the exercise 3 times. 4. Next, do thesequence again, this time putting your hand against the back of your head. Use your head and neck to push backward  against the hand pressure. 5. Finally, do the same exercise on either side of your head, pushing sideways against the pressure of your hand.  Prone Head Lifts Repeat this exercise 5 times. Do this 2 times a day or as told by your health care provider. 1. Lie face-down, resting on your elbows so that your chest and upper back are raised. 2. Start with your head facing downward, near your chest. Position your chin either on or near your chest. 3. Slowly lift your head upward. Lift until you are looking straight ahead. Then continue lifting your head as far back as you can stretch. 4. Hold your head up  for 5 seconds. Then slowly lower it to your starting position.  Supine Head Lifts Repeat this exercise 8-10 times. Do this 2 times a day or as told by your health care provider. 1. Lie on your back, bending your knees to point to the ceiling and keeping your feet flat on the floor. 2. Lift your head slowly off the floor, raising your chin toward your chest. 3. Hold for 5 seconds. 4. Relax and repeat.  Scapular Retraction Repeat this exercise 5 times. Do this 2 times a day or as told by your health care provider. 1. Stand with your arms at your sides. Look straight ahead. 2. Slowly pull both shoulders backward and downward until you feel a stretch between your shoulder blades in your upper back. 3. Hold for 10-30 seconds. 4. Relax and repeat.  Contact a health care provider if:  Your neck pain or discomfort gets much worse when you do an exercise.  Your neck pain or discomfort does not improve within 2 hours after you exercise. If you have any of these problems, stop exercising right away. Do not do the exercises again unless your health care provider says that you can. Get help right away if:  You develop sudden, severe neck pain. If this happens, stop exercising right away. Do not do the exercises again unless your health care provider says that you can. Exercises Neck Stretch  Repeat this exercise 3-5 times. 1. Do this exercise while standing or while sitting in a chair. 2. Place your feet flat on the floor, shoulder-width apart. 3. Slowly turn your head to the right. Turn it all the way to the right so you can look over your right shoulder. Do not tilt or tip your head. 4. Hold this position for 10-30 seconds. 5. Slowly turn your head to the left, to look over your left shoulder. 6. Hold this position for 10-30 seconds.  Neck Retraction Repeat this exercise 8-10 times. Do this 3-4 times a day or as told by your health care provider. 1. Do this exercise while standing or while  sitting in a sturdy chair. 2. Look straight ahead. Do not bend your neck. 3. Use your fingers to push your chin backward. Do not bend your neck for this movement. Continue to face straight ahead. If you are doing the exercise properly, you will feel a slight sensation in your throat and a stretch at the back of your neck. 4. Hold the stretch for 1-2 seconds. Relax and repeat.  This information is not intended to replace advice given to you by your health care provider. Make sure you discuss any questions you have with your health care provider. Document Released: 05/20/2015 Document Revised: 11/14/2015 Document Reviewed: 12/17/2014 Elsevier Interactive Patient Education  2017 [employer]   Shoulder Range of Motion Exercises Shoulder range of motion (  ROM) exercises are designed to keep the shoulder moving freely. They are often recommended for people who have shoulder pain. Phase 1 exercises When you are able, do this exercise 5-6 days per week, or as told by your health care provider. Work toward doing 2 sets of 10 swings. Pendulum Exercise How To Do This Exercise Lying Down 1. Lie face-down on a bed with your abdomen close to the side of the bed. 2. Let your arm hang over the side of the bed. 3. Relax your shoulder, arm, and hand. 4. Slowly and gently swing your arm forward and back. Do not use your neck muscles to swing your arm. They should be relaxed. If you are struggling to swing your arm, have someone gently swing it for you. When you do this exercise for the first time, swing your arm at a 15 degree angle for 15 seconds, or swing your arm 10 times. As pain lessens over time, increase the angle of the swing to 30-45 degrees. 5. Repeat steps 1-4 with the other arm.  How To Do This Exercise While Standing 1. Stand next to a sturdy chair or table and hold on to it with your hand. 1. Bend forward at the waist. 2. Bend your knees slightly. 3. Relax your other arm and let it hang  limp. 4. Relax the shoulder blade of the arm that is hanging and let it drop. 5. While keeping your shoulder relaxed, use body motion to swing your arm in small circles. The first time you do this exercise, swing your arm for about 30 seconds or 10 times. When you do it next time, swing your arm for a little longer. 6. Stand up tall and relax. 7. Repeat steps 1-7, this time changing the direction of the circles. 2. Repeat steps 1-8 with the other arm.  Phase 2 exercises Do these exercises 3-4 times per day on 5-6 days per week or as told by your health care provider. Work toward holding the stretch for 20 seconds. Stretching Exercise 1 1. Lift your arm straight out in front of you. 2. Bend your arm 90 degrees at the elbow (right angle) so your forearm goes across your body and looks like the letter "L." 3. Use your other arm to gently pull the elbow forward and across your body. 4. Repeat steps 1-3 with the other arm. Stretching Exercise 2 You will need a towel or rope for this exercise. 1. Bend one arm behind your back with the palm facing outward. 2. Hold a towel with your other hand. 3. Reach the arm that holds the towel above your head, and bend that arm at the elbow. Your wrist should be behind your neck. 4. Use your free hand to grab the free end of the towel. 5. With the higher hand, gently pull the towel up behind you. 6. With the lower hand, pull the towel down behind you. 7. Repeat steps 1-6 with the other arm.  Phase 3 exercises Do each of these exercises at four different times of day (sessions) every day or as told by your health care provider. To begin with, repeat each exercise 5 times (repetitions). Work toward doing 3 sets of 12 repetitions or as told by your health care provider. Strengthening Exercise 1 You will need a light weight for this activity. As you grow stronger, you may use a heavier weight. 1. Standing with a weight in your hand, lift your arm straight out to  the side until it  is at the same height as your shoulder. 2. Bend your arm at 90 degrees so that your fingers are pointing to the ceiling. 3. Slowly raise your hand until your arm is straight up in the air. 4. Repeat steps 1-3 with the other arm.  Strengthening Exercise 2 You will need a light weight for this activity. As you grow stronger, you may use a heavier weight. 1. Standing with a weight in your hand, gradually move your straight arm in an arc, starting at your side, then out in front of you, then straight up over your head. 2. Gradually move your other arm in an arc, starting at your side, then out in front of you, then straight up over your head. 3. Repeat steps 1-2 with the other arm.  Strengthening Exercise 3 You will need an elastic band for this activity. As you grow stronger, gradually increase the size of the bands or increase the number of bands that you use at one time. 1. While standing, hold an elastic band in one hand and raise that arm up in the air. 2. With your other hand, pull down the band until that hand is by your side. 3. Repeat steps 1-2 with the other arm.  This information is not intended to replace advice given to you by your health care provider. Make sure you discuss any questions you have with your health care provider. Document Released: 03/07/2003 Document Revised: 02/02/2016 Document Reviewed: 06/04/2014 Elsevier Interactive Patient Education  [employer].

## 2016-12-18 NOTE — Progress Notes (Signed)
Subjective:  Patient ID: David Hinton, male    DOB: June 03, 1961  Age: 56 y.o. MRN: [id number]  CC: Arm Pain (right arm pain and tingling going on for a while, from deltoid area down to finger/ has been doing Yoga-ibuprofen and maloxicam help 50%)  Neck Pain   This is a recurrent problem. The current episode started more than 1 year ago. The problem occurs intermittently. The problem has been waxing and waning. The pain is associated with a sleep position. The pain is present in the right side. The quality of the pain is described as burning, aching and shooting. The pain is moderate. The symptoms are aggravated by twisting and position. The pain is same all the time. Associated symptoms include numbness and tingling. Pertinent negatives include no chest pain, fever, headaches, leg pain, pain with swallowing, paresis, photophobia, syncope, trouble swallowing, visual change, weakness or weight loss. He has tried NSAIDs and home exercises for the symptoms. The treatment provided mild relief.  MRI cervical spine done 2005 indicates: Shallow left medial foraminal disk protrusion at C3-4.  2. Broad based disk protrusion at C5-6 with slight caudal down-turning and mass effect on the thecal sac mainly in the midline. There is mild foraminal encroachment bilaterally.  3. Shallow central disk protrusion at C6-7 which is fairly broad based.  Outpatient Medications Prior to Visit  Medication Sig Dispense Refill  . ALPRAZolam (XANAX) 0.5 MG tablet Take 1 tablet (0.5 mg total) by mouth 2 (two) times daily as needed for anxiety. 60 tablet 1  . irbesartan (AVAPRO) 300 MG tablet Take 1 tablet (300 mg total) by mouth daily. Annual appt is due must see MD for refills 30 tablet 0  . omeprazole (PRILOSEC) 20 MG capsule Take 20 mg by mouth daily.     . QUEtiapine (SEROQUEL) 25 MG tablet Take 1 tablet (25 mg total) by mouth at bedtime. 30 tablet 5  . tadalafil (CIALIS) 5 MG tablet Take 1 tablet (5 mg total) by mouth  daily as needed (BPH). 30 tablet 3  . Suvorexant (BELSOMRA) 20 MG TABS Take 20 mg by mouth at bedtime as needed. (Patient not taking: Reported on 12/18/2016) 30 tablet 5   No facility-administered medications prior to visit.     ROS See HPI  Objective:  BP (!) 148/100   Pulse 86   Temp 98.2 F (36.8 C)   Ht 5\' 11"  (1.803 m)   Wt 184 lb (83.5 kg)   SpO2 98%   BMI 25.66 kg/m   BP Readings from Last 3 Encounters:  12/18/16 (!) 148/100  11/23/16 132/88  10/22/15 (!) 142/100    Wt Readings from Last 3 Encounters:  12/18/16 184 lb (83.5 kg)  11/23/16 177 lb (80.3 kg)  10/22/15 172 lb (78 kg)    Physical Exam  Constitutional: He is oriented to person, place, and time. No distress.  Neck: Normal range of motion. Neck supple. No thyromegaly present.  Cardiovascular: Normal rate and normal heart sounds.   Pulmonary/Chest: Effort normal and breath sounds normal.  Musculoskeletal: He exhibits tenderness. He exhibits no edema.       Right shoulder: Normal.       Right elbow: Normal.      Right wrist: Normal.       Cervical back: He exhibits tenderness, pain and spasm. He exhibits normal range of motion, no bony tenderness, no swelling, no edema and normal pulse.       Thoracic back: Normal.  Left upper arm: Normal.       Left forearm: Normal.       Left hand: Normal.  Positive Spurling test on right side  Lymphadenopathy:    He has no cervical adenopathy.  Neurological: He is alert and oriented to person, place, and time. He has normal reflexes. No cranial nerve deficit. Coordination normal.  Skin: Skin is warm and dry.  Vitals reviewed.  Lab Results  Component Value Date   WBC 5.8 11/23/2016   HGB 15.1 11/23/2016   HCT 43.8 11/23/2016   PLT 241.0 11/23/2016   GLUCOSE 84 11/23/2016   CHOL 176 11/23/2016   TRIG 366.0 (H) 11/23/2016   HDL 34.10 (L) 11/23/2016   LDLDIRECT 88.0 11/23/2016   LDLCALC 146 (H) 11/16/2013   ALT 16 11/23/2016   AST 19 11/23/2016   NA  139 11/23/2016   K 4.2 11/23/2016   CL 106 11/23/2016   CREATININE 1.17 11/23/2016   BUN 20 11/23/2016   CO2 25 11/23/2016   TSH 3.19 11/23/2016   PSA 0.50 11/23/2016    Dg Chest 2 View  Result Date: 11/27/2016 CLINICAL DATA:  Cough. EXAM: CHEST  2 VIEW COMPARISON:  Radiographs of August 23, 2014. FINDINGS: The heart size and mediastinal contours are within normal limits. Both lungs are clear. No pneumothorax or pleural effusion is noted. The visualized skeletal structures are unremarkable. IMPRESSION: No active cardiopulmonary disease. Electronically Signed   By: Marijo Conception, M.D.   On: 11/27/2016 13:11    Assessment & Plan:   Fardeen was seen today for arm pain.  Diagnoses and all orders for this visit:  Cervical radiculopathy due to degenerative joint disease of spine -     predniSONE (STERAPRED UNI-PAK 21 TAB) 10 MG (21) TBPK tablet; Take by mouth as directed. -     methocarbamol (ROBAXIN-750) 750 MG tablet; Take 1 tablet (750 mg total) by mouth every 8 (eight) hours as needed for muscle spasms. -     DG Cervical Spine Complete; Future -     Ambulatory referral to Neurosurgery  DDD (degenerative disc disease), cervical   I am having [name] start on predniSONE and methocarbamol. I am also having him maintain his omeprazole, Suvorexant, ALPRAZolam, irbesartan, tadalafil, and QUEtiapine.  Meds ordered this encounter  Medications  . predniSONE (STERAPRED UNI-PAK 21 TAB) 10 MG (21) TBPK tablet    Sig: Take by mouth as directed.    Dispense:  21 tablet    Refill:  0    Order Specific Question:   Supervising Provider    Answer:   Cassandria Anger [[id number]]  . methocarbamol (ROBAXIN-750) 750 MG tablet    Sig: Take 1 tablet (750 mg total) by mouth every 8 (eight) hours as needed for muscle spasms.    Dispense:  30 tablet    Refill:  0    Order Specific Question:   Supervising Provider    Answer:   Cassandria Anger [[id number]]    Follow-up: Return if symptoms worsen or  fail to improve.  Wilfred Lacy, NP

## 2017-01-05 ENCOUNTER — Ambulatory Visit: Payer: BLUE CROSS/BLUE SHIELD | Admitting: Internal Medicine

## 2017-01-06 ENCOUNTER — Other Ambulatory Visit: Payer: Self-pay | Admitting: Internal Medicine

## 2017-01-07 NOTE — Telephone Encounter (Signed)
Irbesartan has already been sent pls advise on Xanax...Johny Chess

## 2017-01-07 NOTE — Telephone Encounter (Signed)
Pt called regarding this, would like it sent to [facility] on [address]. Had CPE on 11/23/16 Also would like a refill of Xanax

## 2017-01-07 NOTE — Telephone Encounter (Signed)
OK to fill xanax prescription(s) with additional refills x1 Thx

## 2017-01-08 MED ORDER — ALPRAZOLAM 0.5 MG PO TABS: 0.5000 mg | ORAL_TABLET | Freq: Two times a day (BID) | ORAL | 1 refills | Status: DC | PRN

## 2017-01-08 NOTE — Telephone Encounter (Signed)
Updated epic... called refill into pharmacy had to leave on pharmacy vm...Johny Chess

## 2017-01-19 ENCOUNTER — Ambulatory Visit: Payer: BLUE CROSS/BLUE SHIELD | Admitting: Internal Medicine

## 2017-01-26 ENCOUNTER — Ambulatory Visit: Payer: BLUE CROSS/BLUE SHIELD | Admitting: Internal Medicine

## 2017-03-22 ENCOUNTER — Telehealth: Payer: Self-pay | Admitting: Internal Medicine

## 2017-03-23 NOTE — Telephone Encounter (Signed)
Pt called requesting a refill on this medication.

## 2017-03-25 NOTE — Telephone Encounter (Signed)
RX faxed

## 2017-04-12 ENCOUNTER — Ambulatory Visit: Payer: BLUE CROSS/BLUE SHIELD | Admitting: Internal Medicine

## 2017-04-15 ENCOUNTER — Ambulatory Visit (INDEPENDENT_AMBULATORY_CARE_PROVIDER_SITE_OTHER): Payer: Self-pay | Admitting: Internal Medicine

## 2017-04-15 DIAGNOSIS — E291 Testicular hypofunction: Secondary | ICD-10-CM

## 2017-04-15 MED ORDER — TESTOSTERONE CYPIONATE 200 MG/ML IM SOLN: 100.0000 mg | INTRAMUSCULAR | 5 refills | Status: DC

## 2017-04-15 MED ORDER — VITAMIN D3 50 MCG (2000 UT) PO CAPS: 2000.0000 [IU] | ORAL_CAPSULE | Freq: Every day | ORAL | 3 refills | Status: DC

## 2017-04-15 MED ORDER — "SYRINGE/NEEDLE (DISP) 23G X 1"" 3 ML MISC": 2 refills | Status: DC

## 2017-04-15 NOTE — Progress Notes (Signed)
Subjective:  Patient ID: David Hinton, male    DOB: Sep 01, 1960  Age: 56 y.o. MRN: [id number]  CC: No chief complaint on file.   HPI David Hinton presents for low testosterone  Outpatient Medications Prior to Visit  Medication Sig Dispense Refill  . ALPRAZolam (XANAX) 0.5 MG tablet take 1 tablet by mouth twice a day if needed for anxiety 60 tablet 1  . irbesartan (AVAPRO) 300 MG tablet take 1 tablet by mouth daily 90 tablet 2  . omeprazole (PRILOSEC) 20 MG capsule Take 20 mg by mouth daily.     . tadalafil (CIALIS) 5 MG tablet Take 1 tablet (5 mg total) by mouth daily as needed (BPH). 30 tablet 3  . methocarbamol (ROBAXIN-750) 750 MG tablet Take 1 tablet (750 mg total) by mouth every 8 (eight) hours as needed for muscle spasms. (Patient not taking: Reported on 04/15/2017) 30 tablet 0  . predniSONE (STERAPRED UNI-PAK 21 TAB) 10 MG (21) TBPK tablet Take by mouth as directed. 21 tablet 0  . QUEtiapine (SEROQUEL) 25 MG tablet Take 1 tablet (25 mg total) by mouth at bedtime. (Patient not taking: Reported on 04/15/2017) 30 tablet 5  . Suvorexant (BELSOMRA) 20 MG TABS Take 20 mg by mouth at bedtime as needed. (Patient not taking: Reported on 12/18/2016) 30 tablet 5   No facility-administered medications prior to visit.     ROS Review of Systems  Constitutional: Positive for fatigue. Negative for appetite change and unexpected weight change.  HENT: Negative for congestion, nosebleeds, sneezing, sore throat and trouble swallowing.   Eyes: Negative for itching and visual disturbance.  Respiratory: Negative for cough.   Cardiovascular: Negative for chest pain, palpitations and leg swelling.  Gastrointestinal: Negative for abdominal distention, blood in stool, diarrhea and nausea.  Genitourinary: Negative for frequency and hematuria.  Musculoskeletal: Negative for back pain, gait problem, joint swelling and neck pain.  Skin: Negative for rash.  Neurological: Negative for dizziness,  tremors, speech difficulty and weakness.  Psychiatric/Behavioral: Negative for agitation, dysphoric mood and sleep disturbance. The patient is nervous/anxious.     Objective:  BP (!) 146/92 (BP Location: Left Arm, Patient Position: Sitting, Cuff Size: Large)   Pulse (!) 50   Temp 98.8 F (37.1 C) (Oral)   Ht 5\' 11"  (1.803 m)   Wt 189 lb (85.7 kg)   SpO2 99%   BMI 26.36 kg/m   BP Readings from Last 3 Encounters:  04/15/17 (!) 146/92  12/18/16 (!) 148/100  11/23/16 132/88    Wt Readings from Last 3 Encounters:  04/15/17 189 lb (85.7 kg)  12/18/16 184 lb (83.5 kg)  11/23/16 177 lb (80.3 kg)    Physical Exam  Constitutional: He is oriented to person, place, and time. He appears well-developed. No distress.  NAD  HENT:  Mouth/Throat: Oropharynx is clear and moist.  Eyes: Pupils are equal, round, and reactive to light. Conjunctivae are normal.  Neck: Normal range of motion. No JVD present. No thyromegaly present.  Cardiovascular: Normal rate, regular rhythm, normal heart sounds and intact distal pulses.  Exam reveals no gallop and no friction rub.   No murmur heard. Pulmonary/Chest: Effort normal and breath sounds normal. No respiratory distress. He has no wheezes. He has no rales. He exhibits no tenderness.  Abdominal: Soft. Bowel sounds are normal. He exhibits no distension and no mass. There is no tenderness. There is no rebound and no guarding.  Musculoskeletal: Normal range of motion. He exhibits no edema or tenderness.  Lymphadenopathy:    He has no cervical adenopathy.  Neurological: He is alert and oriented to person, place, and time. He has normal reflexes. No cranial nerve deficit. He exhibits normal muscle tone. He displays a negative Romberg sign. Coordination and gait normal.  Skin: Skin is warm and dry. No rash noted.  Psychiatric: He has a normal mood and affect. His behavior is normal. Judgment and thought content normal.    Lab Results  Component Value Date     WBC 5.8 11/23/2016   HGB 15.1 11/23/2016   HCT 43.8 11/23/2016   PLT 241.0 11/23/2016   GLUCOSE 84 11/23/2016   CHOL 176 11/23/2016   TRIG 366.0 (H) 11/23/2016   HDL 34.10 (L) 11/23/2016   LDLDIRECT 88.0 11/23/2016   LDLCALC 146 (H) 11/16/2013   ALT 16 11/23/2016   AST 19 11/23/2016   NA 139 11/23/2016   K 4.2 11/23/2016   CL 106 11/23/2016   CREATININE 1.17 11/23/2016   BUN 20 11/23/2016   CO2 25 11/23/2016   TSH 3.19 11/23/2016   PSA 0.50 11/23/2016    Dg Chest 2 View  Result Date: 11/27/2016 CLINICAL DATA:  Cough. EXAM: CHEST  2 VIEW COMPARISON:  Radiographs of August 23, 2014. FINDINGS: The heart size and mediastinal contours are within normal limits. Both lungs are clear. No pneumothorax or pleural effusion is noted. The visualized skeletal structures are unremarkable. IMPRESSION: No active cardiopulmonary disease. Electronically Signed   By: Marijo Conception, M.D.   On: 11/27/2016 13:11    Assessment & Plan:   There are no diagnoses linked to this encounter. I have discontinued Mr. Bellin Suvorexant, QUEtiapine, predniSONE, and methocarbamol. I am also having him maintain his omeprazole, tadalafil, irbesartan, and ALPRAZolam.  No orders of the defined types were placed in this encounter.    Follow-up: No Follow-up on file.  Walker Kehr, MD

## 2017-04-15 NOTE — Assessment & Plan Note (Signed)
Options discussed Will try inj due to cost

## 2017-06-03 ENCOUNTER — Other Ambulatory Visit: Payer: Self-pay | Admitting: Internal Medicine

## 2017-06-03 NOTE — Telephone Encounter (Unsigned)
Copied from [place]. Topic: Quick Communication - Rx Refill/Question >> Jun 03, 2017  2:39 PM Neva Seat wrote: Has the patient contacted their pharmacy? Yes - sent request for the refill approval   Preferred Pharmacy (with phone number or street name): [facility] on [address] - Pt is going out of town in the morning and is requesting this be filled asap  Alprazolam

## 2017-06-03 NOTE — Telephone Encounter (Signed)
Pt requesting Alprazolam refill. Pt states he is going out of town in the morning. Last OV 04/15/17. Last refill on 03/23/17.

## 2017-06-04 MED ORDER — ALPRAZOLAM 0.5 MG PO TABS: ORAL_TABLET | ORAL | 0 refills | Status: DC

## 2017-06-04 NOTE — Telephone Encounter (Signed)
Sent to pharmacy on file. [facility]

## 2017-06-04 NOTE — Telephone Encounter (Signed)
Please advise about refill in Dr. Plotnikovs absence. 

## 2017-07-10 ENCOUNTER — Telehealth: Payer: Self-pay | Admitting: Surgical

## 2017-07-10 NOTE — Telephone Encounter (Signed)
Agree.  No weekend or other after-hours RF's of controlled substances. Pt very upset and rude to nurse and receptionist.

## 2017-07-10 NOTE — Telephone Encounter (Signed)
[facility] wanting the doctor to refill his prescription for xanax.After speaking with Dr. Anitra Lauth  I explained to the patient that we do not refill controled prescriptions on the weekend. 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 I apologized I explained that he would have to contact his primary doctors office on Monday for refills. He was very upset that this could not be handled today and stated that he does not treat his animals this way. I again apologized and explained again that he would have to contact primary PCP office Monday to get refills. He continued to be upset. I eventually had to tell the patient that we was seeing patients and had to discontinue the call.

## 2017-07-12 ENCOUNTER — Telehealth: Payer: Self-pay | Admitting: Internal Medicine

## 2017-07-12 MED ORDER — ALPRAZOLAM 0.5 MG PO TABS: ORAL_TABLET | ORAL | 1 refills | Status: DC

## 2017-07-12 NOTE — Telephone Encounter (Signed)
Prescription emailed.  Thank you 

## 2017-07-12 NOTE — Telephone Encounter (Signed)
Copied from [place] [phone]. Topic: Quick Communication - Rx Refill/Question >> Jul 12, 2017 11:03 AM Arletha Grippe wrote: Medication: alprazolam    Has the patient contacted their pharmacy? Yes.  Did not have refill    (Agent: If no, request that the patient contact the pharmacy for the refill.)   Preferred Pharmacy (with phone number or street name): [facility] on [address] -  Pt is out of medication,     Agent: Please be advised that RX refills may take up to 3 business days. We ask that you follow-up with your pharmacy.

## 2017-07-19 ENCOUNTER — Encounter: Payer: Self-pay | Admitting: Internal Medicine

## 2017-07-19 ENCOUNTER — Ambulatory Visit: Payer: BLUE CROSS/BLUE SHIELD | Admitting: Internal Medicine

## 2017-07-19 DIAGNOSIS — I10 Essential (primary) hypertension: Secondary | ICD-10-CM

## 2017-07-19 DIAGNOSIS — E559 Vitamin D deficiency, unspecified: Secondary | ICD-10-CM

## 2017-07-19 DIAGNOSIS — E291 Testicular hypofunction: Secondary | ICD-10-CM | POA: Diagnosis not present

## 2017-07-19 MED ORDER — ALPRAZOLAM 0.5 MG PO TABS: ORAL_TABLET | ORAL | 5 refills | Status: DC

## 2017-07-19 MED ORDER — TESTOSTERONE 20.25 MG/ACT (1.62%) TD GEL: 2.0000 | TRANSDERMAL | 5 refills | Status: DC

## 2017-07-19 NOTE — Progress Notes (Signed)
Subjective:  Patient ID: David Hinton, male    DOB: 1961/03/10  Age: 57 y.o. MRN: [id number]  CC: No chief complaint on file.   HPI David Hinton presents for anxiety, HTN, hypogonadism - he did not take injections  Outpatient Medications Prior to Visit  Medication Sig Dispense Refill  . ALPRAZolam (XANAX) 0.5 MG tablet take 1 tablet by mouth twice a day if needed for anxiety 60 tablet 1  . Cholecalciferol (VITAMIN D3) 2000 units capsule Take 1 capsule (2,000 Units total) by mouth daily. 100 capsule 3  . irbesartan (AVAPRO) 300 MG tablet take 1 tablet by mouth daily 90 tablet 2  . omeprazole (PRILOSEC) 20 MG capsule Take 20 mg by mouth daily.     . SYRINGE-NEEDLE, DISP, 3 ML (BD ECLIPSE SYRINGE) 23G X 1" 3 ML MISC As directed 50 each 2  . tadalafil (CIALIS) 5 MG tablet Take 1 tablet (5 mg total) by mouth daily as needed (BPH). 30 tablet 3  . testosterone cypionate (DEPOTESTOSTERONE CYPIONATE) 200 MG/ML injection Inject 0.5 mLs (100 mg total) into the muscle once a week. (Patient not taking: Reported on 07/19/2017) 10 mL 5   No facility-administered medications prior to visit.     ROS Review of Systems  Constitutional: Negative for appetite change, fatigue and unexpected weight change.  HENT: Negative for congestion, nosebleeds, sneezing, sore throat and trouble swallowing.   Eyes: Negative for itching and visual disturbance.  Respiratory: Negative for cough.   Cardiovascular: Negative for chest pain, palpitations and leg swelling.  Gastrointestinal: Negative for abdominal distention, blood in stool, diarrhea and nausea.  Genitourinary: Negative for frequency and hematuria.  Musculoskeletal: Negative for back pain, gait problem, joint swelling and neck pain.  Skin: Negative for rash.  Neurological: Negative for dizziness, tremors, speech difficulty and weakness.  Psychiatric/Behavioral: Negative for agitation, dysphoric mood and sleep disturbance. The patient is  nervous/anxious.     Objective:  BP (!) 142/92 (BP Location: Left Arm, Patient Position: Sitting, Cuff Size: Large)   Pulse 63   Temp 98.3 F (36.8 C) (Oral)   Ht 5\' 11"  (1.803 m)   Wt 182 lb (82.6 kg)   SpO2 99%   BMI 25.38 kg/m   BP Readings from Last 3 Encounters:  07/19/17 (!) 142/92  04/15/17 (!) 146/92  12/18/16 (!) 148/100    Wt Readings from Last 3 Encounters:  07/19/17 182 lb (82.6 kg)  04/15/17 189 lb (85.7 kg)  12/18/16 184 lb (83.5 kg)    Physical Exam  Constitutional: He is oriented to person, place, and time. He appears well-developed. No distress.  NAD  HENT:  Mouth/Throat: Oropharynx is clear and moist.  Eyes: Conjunctivae are normal. Pupils are equal, round, and reactive to light.  Neck: Normal range of motion. No JVD present. No thyromegaly present.  Cardiovascular: Normal rate, regular rhythm, normal heart sounds and intact distal pulses. Exam reveals no gallop and no friction rub.  No murmur heard. Pulmonary/Chest: Effort normal and breath sounds normal. No respiratory distress. He has no wheezes. He has no rales. He exhibits no tenderness.  Abdominal: Soft. Bowel sounds are normal. He exhibits no distension and no mass. There is no tenderness. There is no rebound and no guarding.  Musculoskeletal: Normal range of motion. He exhibits no edema or tenderness.  Lymphadenopathy:    He has no cervical adenopathy.  Neurological: He is alert and oriented to person, place, and time. He has normal reflexes. No cranial nerve deficit. He exhibits  normal muscle tone. He displays a negative Romberg sign. Coordination and gait normal.  Skin: Skin is warm and dry. No rash noted.  Psychiatric: He has a normal mood and affect. His behavior is normal. Judgment and thought content normal.    Lab Results  Component Value Date   WBC 5.8 11/23/2016   HGB 15.1 11/23/2016   HCT 43.8 11/23/2016   PLT 241.0 11/23/2016   GLUCOSE 84 11/23/2016   CHOL 176 11/23/2016   TRIG  366.0 (H) 11/23/2016   HDL 34.10 (L) 11/23/2016   LDLDIRECT 88.0 11/23/2016   LDLCALC 146 (H) 11/16/2013   ALT 16 11/23/2016   AST 19 11/23/2016   NA 139 11/23/2016   K 4.2 11/23/2016   CL 106 11/23/2016   CREATININE 1.17 11/23/2016   BUN 20 11/23/2016   CO2 25 11/23/2016   TSH 3.19 11/23/2016   PSA 0.50 11/23/2016    Dg Chest 2 View  Result Date: 11/27/2016 CLINICAL DATA:  Cough. EXAM: CHEST  2 VIEW COMPARISON:  Radiographs of August 23, 2014. FINDINGS: The heart size and mediastinal contours are within normal limits. Both lungs are clear. No pneumothorax or pleural effusion is noted. The visualized skeletal structures are unremarkable. IMPRESSION: No active cardiopulmonary disease. Electronically Signed   By: Marijo Conception, M.D.   On: 11/27/2016 13:11    Assessment & Plan:   There are no diagnoses linked to this encounter. I have discontinued Roscoe C. Aldama's testosterone cypionate. I am also having him maintain his omeprazole, tadalafil, irbesartan, SYRINGE-NEEDLE (DISP) 3 ML, Vitamin D3, and ALPRAZolam.  No orders of the defined types were placed in this encounter.    Follow-up: No Follow-up on file.  Walker Kehr, MD

## 2017-07-19 NOTE — Assessment & Plan Note (Signed)
On Vit D 

## 2017-07-19 NOTE — Patient Instructions (Addendum)
Testosterone nasal gel What is this medicine? TESTOSTERONE (tes TOS ter one) is the main male hormone. It supports normal male traits such as muscle growth, facial hair, and deep voice. This medicine is used in males to treat low testosterone levels. This medicine may be used for other purposes; ask your health care provider or pharmacist if you have questions. COMMON BRAND NAME(S): Natesto What should I tell my health care provider before I take this medicine? They need to know if you have any of these conditions: -breast cancer -breathing problems while you sleep (sleep apnea) -diabetes -current nose or sinus problems like runny nose, sinus surgery, or broken nose -heart disease -kidney disease -liver disease -lung disease -prostate cancer, enlargement -Sjogren's syndrome -an unusual or allergic reaction to testosterone, other medicines, foods, dyes, or preservatives -if a male partner is pregnant or trying to get pregnant How should I use this medicine? This medicine is for use in the nose. Follow the directions on the prescription label. Blow your nose gently before applying this medicine. Wash your hands after use. Do not use on any other body part. Do not take your medicine more often than directed. A special MedGuide will be given to you by the pharmacist with each prescription and refill. Be sure to read this information carefully each time. Talk to your pediatrician regarding the use of this medicine in children. Special care may be needed. Overdosage: If you think you have taken too much of this medicine contact a poison control center or emergency room at once. NOTE: This medicine is only for you. Do not share this medicine with others. What if I miss a dose? If you miss a dose, use it as soon as you can. If it is almost time for your next dose, use only that dose. Do not use double or extra doses. What may interact with this medicine? -certain medicines for  diabetes -certain medicines that treat or prevent blood clots like warfarin -other nasal sprays -steroid medicines like prednisone or cortisone This list may not describe all possible interactions. Give your health care provider a list of all the medicines, herbs, non-prescription drugs, or dietary supplements you use. Also tell them if you smoke, drink alcohol, or use illegal drugs. Some items may interact with your medicine. What should I watch for while using this medicine? Visit your doctor or health care professional for regular checks on your progress. They will need to check the level of testosterone in your blood. This medicine is only approved for use in men who have low levels of testosterone related to certain medical conditions. Heart attacks and strokes have been reported with the use of this medicine. Notify your doctor or health care professional and seek emergency treatment if you develop breathing problems; changes in vision; confusion; chest pain or chest tightness; sudden arm pain; severe, sudden headache; trouble speaking or understanding; sudden numbness or weakness of the face, arm or leg; loss of balance or coordination. Talk to your doctor about the risks and benefits of this medicine. This medicine may affect blood sugar levels. If you have diabetes, check with your doctor or health care professional before you change your diet or the dose of your diabetic medicine. Ask your doctor or pharmacist before applying other medicines in the nose. This drug is banned from use in athletes by most athletic organizations. What side effects may I notice from receiving this medicine? Side effects that you should report to your doctor or health care professional as  soon as possible: -allergic reactions like skin rash, itching or hives, swelling of the face, lips, or tongue -breast enlargement -breathing problems -changes in emotions or moods, especially anger, depression, or rage -dark  urine -general ill feeling or flu-like symptoms -light-colored stools -loss of appetite, nausea -nausea, vomiting -pain, swelling, warmth in the leg -right upper belly pain -stomach pain -swelling of the ankles, feet, hands -too frequent or persistent erections -trouble passing urine or change in the amount of urine -unusually weak or tired -yellowing of the eyes or skin Side effects that usually do not require medical attention (report to your doctor or health care professional if they continue or are bothersome): -acne -change in sex drive or performance -cough -diarrhea -hair loss -headache -nose bleed -nose pain -nose scabs -sore throat -runny nose This list may not describe all possible side effects. Call your doctor for medical advice about side effects. You may report side effects to FDA at 1-800-FDA-1088. Where should I keep my medicine? Keep out of the reach of children. This medicine can be abused. Keep your medicine in a safe place to protect it from theft. Do not share this medicine with anyone. Selling or giving away this medicine is dangerous and against the law. Store at room temperature between 20 to 25 degrees C (68 to 77 degrees F). Throw away any unused medicine after the expiration date. Replace your dispenser when the top of the piston inside the dispenser reaches the arrow at the top of the inside label. Safely throw away your empty dispenser in your household trash away from pets and children. NOTE: This sheet is a summary. It may not cover all possible information. If you have questions about this medicine, talk to your doctor, pharmacist, or health care provider.  2018 Elsevier/Gold Standard (2015-07-11 08:39:15) Testosterone skin gel What is this medicine? TESTOSTERONE (tes TOS ter one) is the main male hormone. It supports normal male traits such as muscle growth, facial hair, and deep voice. This gel is used in males to treat low testosterone  levels. This medicine may be used for other purposes; ask your health care provider or pharmacist if you have questions. COMMON BRAND NAME(S): AndroGel, FORTESTA, Testim, Vogelxo What should I tell my health care provider before I take this medicine? They need to know if you have any of these conditions: -breast cancer -diabetes -heart disease -if a male partner is pregnant or trying to get pregnant -kidney disease -liver disease -lung disease -prostate cancer, enlargement -an unusual or allergic reaction to testosterone, soy proteins, other medicines, foods, dyes, or preservatives -pregnant or trying to get pregnant -breast-feeding How should I use this medicine? This medicine is for external use only. This medicine is applied at the same time every day (preferably in the morning) to clean, dry, intact skin. If you take a bath or shower in the morning, apply the gel after the bath or shower. Follow the directions on the prescription label. Make sure that you are using your testosterone gel product correctly and applying it only to the appropriate skin area (see below). Allow the skin to dry a few minutes then cover with clothing to prevent others from coming in contact with the medicine on your skin. The gel is flammable. Avoid fire, flame, or smoking until the gel has dried. Wash your hands with soap and water after use. For AndroGel 1% Packets: Open the packet(s) needed for your dose. You can put the entire dose into your palm all at once or  just a little at a time to apply. If you prefer, you can instead squeeze the gel directly onto the area you are applying it to. Apply on the shoulders, upper arm, or abdomen as directed. Do not apply to the scrotum or genitals. Be sure you use the correct total dose. It is best to wait 5 to 6 hours after application of the gel before showering or swimming. For AndroGel 1%: Pump the dose into the palm of your hand. You can put the entire dose into your  palm all at once or just a little at a time to apply. If you prefer, you can instead pump the gel directly onto the area you are applying it to. Apply on the shoulders, upper arm, or abdomen as directed. Do not apply to the scrotum or genitals. Be sure you use the correct total dose. It is best to wait for 5 to 6 hours after application of the gel before showering or swimming. For Androgel 1.62% packets: Open the packet(s) needed for your dose. You can put the entire dose into your palm all at once or just a little at a time to apply. If you prefer, you can instead squeeze the gel directly onto the area you are applying it to. Apply on the shoulders and upper arms as directed. Do not apply to other parts of the body including the abdomen, genitals, chest, armpits, or knees. Be sure you use the correct total dose. It is best to wait 2 hours after application of the gel before washing, showering, or swimming. For AndroGel 1.62%: Pump the dose into the palm of your hand. Dispense one pump of gel at a time into the palm of your hand before applying it. If you prefer, you can instead pump the gel directly onto the area you are applying it to. Apply on the shoulders and upper arms as directed. Do not apply to other parts of the body including the abdomen, genitals, chest, armpits, or knees. Be sure you use the correct total dose. It is best to wait 2 hours after application of the gel before washing, showering, or swimming. For Testim: Open the tube(s) needed for your dose. Squeeze the gel from the tube into the palm of your hand. Apply on the shoulders or upper arms as directed. Do not apply to the scrotum, genitals, or abdomen. Be sure you use the correct total dose. Do not shower or swim for at least 2 hours after application of the gel. For Fortesta: Use the multi-dose pump to pump the gel directly onto the area you are applying it to. Apply on the thighs as directed. Do not apply to the abdomen, penis, scrotum,  shoulders or upper arms. Gently rub the gel onto the skin using your finger. Be sure you use the correct total dose. Do not shower or swim for at least 2 hours after application of the gel. A special MedGuide will be given to you by the pharmacist with each prescription and refill. Be sure to read this information carefully each time. Talk to your pediatrician regarding the use of this medicine in children. Special care may be needed. Overdosage: If you think you have taken too much of this medicine contact a poison control center or emergency room at once. NOTE: This medicine is only for you. Do not share this medicine with others. What if I miss a dose? If you miss a dose, use it as soon as you can. If it is almost time  for your next dose, use only that dose. Do not use double or extra doses. What may interact with this medicine? -medicines for diabetes -medicines that treat or prevent blood clots like warfarin -oxyphenbutazone -propranolol -steroid medicines like prednisone or cortisone This list may not describe all possible interactions. Give your health care provider a list of all the medicines, herbs, non-prescription drugs, or dietary supplements you use. Also tell them if you smoke, drink alcohol, or use illegal drugs. Some items may interact with your medicine. What should I watch for while using this medicine? Visit your doctor or health care professional for regular checks on your progress. They will need to check the level of testosterone in your blood. This medicine is only approved for use in men who have low levels of testosterone related to certain medical conditions. Heart attacks and strokes have been reported with the use of this medicine. Notify your doctor or health care professional and seek emergency treatment if you develop breathing problems; changes in vision; confusion; chest pain or chest tightness; sudden arm pain; severe, sudden headache; trouble speaking or  understanding; sudden numbness or weakness of the face, arm or leg; loss of balance or coordination. Talk to your doctor about the risks and benefits of this medicine. This medicine can transfer from your body to others. If a person or pet comes in contact with the area where this medicine was applied to your skin, they may have a serious risk of side effects. If you cannot avoid skin-to-skin contact with another person, make sure the site where this medicine was applied is covered with clothing. If accidental contact happens, the skin of the person or pet should be washed right away with soap and water. Also, a male partner who is pregnant or trying to get pregnant should avoid contact with the gel or treated skin. This medicine may affect blood sugar levels. If you have diabetes, check with your doctor or health care professional before you change your diet or the dose of your diabetic medicine. This drug is banned from use in athletes by most athletic organizations. What side effects may I notice from receiving this medicine? Side effects that you should report to your doctor or health care professional as soon as possible: -allergic reactions like skin rash, itching or hives, swelling of the face, lips, or tongue -breast enlargement -breathing problems -changes in mood, especially anger, depression, or rage -dark urine -general ill feeling or flu-like symptoms -light-colored stools -loss of appetite, nausea -nausea, vomiting -right upper belly pain -stomach pain -swelling of ankles -too frequent or persistent erections -trouble passing urine or change in the amount of urine -unusually weak or tired -yellowing of the eyes or skin Side effects that usually do not require medical attention (report to your doctor or health care professional if they continue or are bothersome): -acne -change in sex drive or performance -hair loss -headache This list may not describe all possible side  effects. Call your doctor for medical advice about side effects. You may report side effects to FDA at 1-800-FDA-1088. Where should I keep my medicine? Keep out of the reach of children. This medicine can be abused. Keep your medicine in a safe place to protect it from theft. Do not share this medicine with anyone. Selling or giving away this medicine is dangerous and against the law. Store at room temperature between 15 to 30 degrees C (59 to 86 degrees F). Keep closed until use. Protect from heat and light. This medicine  is flammable. Avoid exposure to heat, fire, flame, and smoking. Throw away any unused medicine after the expiration date. NOTE: This sheet is a summary. It may not cover all possible information. If you have questions about this medicine, talk to your doctor, pharmacist, or health care provider.  2018 Elsevier/Gold Standard (2013-08-24 08:27:26)   There are natural ways to boost your testosterone:  1. Lose Weight If you're overweight, shedding the excess pounds may increase your testosterone levels, according to multiple research. Overweight men are more likely to have low testosterone levels to begin with, so this is an important trick to increase your body's testosterone production when you need it most.   2. Strength Training    Strength training is also known to boost testosterone levels, provided you are doing so intensely enough. When strength training to boost testosterone, you'll want to increase the weight and lower your number of reps, and then focus on exercises that work a large number of muscles.  3. Optimize Your Vitamin D Levels Vitamin D, a steroid hormone, is essential for the healthy development of the nucleus of the sperm cell, and helps maintain semen quality and sperm count. Vitamin D also increases levels of testosterone, which may boost libido. In one study, overweight men who were given vitamin D supplements had a significant increase in testosterone  levels after one year.  4. Reduce Stress When you're under a lot of stress, your body releases high levels of the stress hormone cortisol. This hormone actually blocks the effects of testosterone, presumably because, from a biological standpoint, testosterone-associated behaviors (mating, competing, aggression) may have lowered your chances of survival in an emergency (hence, the "fight or flight" response is dominant, courtesy of cortisol).  5. Limit or Eliminate Sugar from Your Diet Testosterone levels decrease after you eat sugar, which is likely because the sugar leads to a high insulin level, another factor leading to low testosterone.  6. Eat Healthy Fats By healthy, this means not only mon- and polyunsaturated fats, like that found in avocadoes and nuts, but also saturated, as these are essential for building testosterone. Research shows that a diet with less than 40 percent of energy as fat (and that mainly from animal sources, i.e. saturated) lead to a decrease in testosterone levels.  It's important to understand that your body requires saturated fats from animal and vegetable sources (such as meat, dairy, certain oils, and tropical plants like coconut) for optimal functioning, and if you neglect this important food group in favor of sugar, grains and other starchy carbs, your health and weight are almost guaranteed to suffer. Examples of healthy fats you can eat more of to give your testosterone levels a boost include:  Olives and Olive oil  Coconuts and coconut oil Butter made from organic milk  Raw nuts, such as, almonds or pecans Eggs Avocados   Meats Palm oil Unheated organic nut oils   7. "Testosterone boosters" containing Vitamin D-3, Niacin, Vitamin B-6, Vitamin B-12, Magnesium, Zinc, Selenium, D-Aspartic Acid, Fenugreed Seed Extract, Oystershell, [place] Extract, [place] Ginseng may be helpful as well.

## 2017-07-19 NOTE — Assessment & Plan Note (Addendum)
We discussed replacement options  Potential benefits of a long term testosterone use as well as potential risks (BPH, MI, OSA, cancer)  and complications were explained to the patient and were aknowledged. Androgel Nasal

## 2017-07-19 NOTE — Assessment & Plan Note (Signed)
BP Readings from Last 3 Encounters:  07/19/17 (!) 142/92  04/15/17 (!) 146/92  12/18/16 (!) 148/100

## 2017-09-06 ENCOUNTER — Ambulatory Visit: Payer: BLUE CROSS/BLUE SHIELD | Admitting: Internal Medicine

## 2017-09-06 ENCOUNTER — Encounter: Payer: Self-pay | Admitting: Internal Medicine

## 2017-09-06 DIAGNOSIS — E559 Vitamin D deficiency, unspecified: Secondary | ICD-10-CM | POA: Diagnosis not present

## 2017-09-06 DIAGNOSIS — E291 Testicular hypofunction: Secondary | ICD-10-CM

## 2017-09-06 DIAGNOSIS — M79641 Pain in right hand: Secondary | ICD-10-CM | POA: Diagnosis not present

## 2017-09-06 DIAGNOSIS — I10 Essential (primary) hypertension: Secondary | ICD-10-CM | POA: Diagnosis not present

## 2017-09-06 DIAGNOSIS — F411 Generalized anxiety disorder: Secondary | ICD-10-CM

## 2017-09-06 DIAGNOSIS — M79642 Pain in left hand: Secondary | ICD-10-CM | POA: Diagnosis not present

## 2017-09-06 DIAGNOSIS — M180 Bilateral primary osteoarthritis of first carpometacarpal joints: Secondary | ICD-10-CM | POA: Diagnosis not present

## 2017-09-06 DIAGNOSIS — M79644 Pain in right finger(s): Secondary | ICD-10-CM | POA: Diagnosis not present

## 2017-09-06 MED ORDER — ALPRAZOLAM 0.5 MG PO TABS: ORAL_TABLET | ORAL | 5 refills | Status: DC

## 2017-09-06 MED ORDER — IRBESARTAN 300 MG PO TABS: 300.0000 mg | ORAL_TABLET | Freq: Every day | ORAL | 3 refills | Status: DC

## 2017-09-06 MED ORDER — OMEPRAZOLE 20 MG PO CPDR: 40.0000 mg | DELAYED_RELEASE_CAPSULE | Freq: Every day | ORAL | 11 refills | Status: AC

## 2017-09-06 NOTE — Assessment & Plan Note (Signed)
Pt declined Rx Repeat labs

## 2017-09-06 NOTE — Progress Notes (Signed)
Subjective:  Patient ID: David Hinton, male    DOB: Dec 27, 1960  Age: 57 y.o. MRN: [id number]  CC: No chief complaint on file.   HPI David Hinton presents for low testosterone - not using replacement Rx F/u anxiety - less stress; sold his house F/u GERD  Outpatient Medications Prior to Visit  Medication Sig Dispense Refill  . Cholecalciferol (VITAMIN D3) 2000 units capsule Take 1 capsule (2,000 Units total) by mouth daily. 100 capsule 3  . tadalafil (CIALIS) 5 MG tablet Take 1 tablet (5 mg total) by mouth daily as needed (BPH). 30 tablet 3  . ALPRAZolam (XANAX) 0.5 MG tablet take 1 tablet by mouth twice a day if needed for anxiety 60 tablet 5  . irbesartan (AVAPRO) 300 MG tablet take 1 tablet by mouth daily 90 tablet 2  . omeprazole (PRILOSEC) 20 MG capsule Take 20 mg by mouth daily.     . SYRINGE-NEEDLE, DISP, 3 ML (BD ECLIPSE SYRINGE) 23G X 1" 3 ML MISC As directed 50 each 2  . Testosterone (ANDROGEL PUMP) 20.25 MG/ACT (1.62%) GEL Place 2 Act onto the skin every morning. 75 g 5   No facility-administered medications prior to visit.     ROS Review of Systems  Constitutional: Negative for appetite change, fatigue and unexpected weight change.  HENT: Negative for congestion, nosebleeds, sneezing, sore throat and trouble swallowing.   Eyes: Negative for itching and visual disturbance.  Respiratory: Negative for cough.   Cardiovascular: Negative for chest pain, palpitations and leg swelling.  Gastrointestinal: Negative for abdominal distention, blood in stool, diarrhea and nausea.  Genitourinary: Negative for frequency and hematuria.  Musculoskeletal: Negative for back pain, gait problem, joint swelling and neck pain.  Skin: Negative for rash.  Neurological: Negative for dizziness, tremors, speech difficulty and weakness.  Psychiatric/Behavioral: Positive for sleep disturbance. Negative for agitation and dysphoric mood. The patient is nervous/anxious.     Objective:  BP  (!) 138/92 (BP Location: Left Arm, Patient Position: Sitting, Cuff Size: Large)   Pulse (!) 51   Temp 98.1 F (36.7 C) (Oral)   Ht 5\' 11"  (1.803 m)   Wt 184 lb (83.5 kg)   SpO2 99%   BMI 25.66 kg/m   BP Readings from Last 3 Encounters:  09/06/17 (!) 138/92  07/19/17 (!) 142/92  04/15/17 (!) 146/92    Wt Readings from Last 3 Encounters:  09/06/17 184 lb (83.5 kg)  07/19/17 182 lb (82.6 kg)  04/15/17 189 lb (85.7 kg)    Physical Exam  Constitutional: He is oriented to person, place, and time. He appears well-developed. No distress.  NAD  HENT:  Mouth/Throat: Oropharynx is clear and moist.  Eyes: Conjunctivae are normal. Pupils are equal, round, and reactive to light.  Neck: Normal range of motion. No JVD present. No thyromegaly present.  Cardiovascular: Normal rate, regular rhythm, normal heart sounds and intact distal pulses. Exam reveals no gallop and no friction rub.  No murmur heard. Pulmonary/Chest: Effort normal and breath sounds normal. No respiratory distress. He has no wheezes. He has no rales. He exhibits no tenderness.  Abdominal: Soft. Bowel sounds are normal. He exhibits no distension and no mass. There is no tenderness. There is no rebound and no guarding.  Musculoskeletal: Normal range of motion. He exhibits no edema or tenderness.  Lymphadenopathy:    He has no cervical adenopathy.  Neurological: He is alert and oriented to person, place, and time. He has normal reflexes. No cranial nerve deficit. He  exhibits normal muscle tone. He displays a negative Romberg sign. Coordination and gait normal.  Skin: Skin is warm and dry. No rash noted.  Psychiatric: He has a normal mood and affect. His behavior is normal. Judgment and thought content normal.    Lab Results  Component Value Date   WBC 5.8 11/23/2016   HGB 15.1 11/23/2016   HCT 43.8 11/23/2016   PLT 241.0 11/23/2016   GLUCOSE 84 11/23/2016   CHOL 176 11/23/2016   TRIG 366.0 (H) 11/23/2016   HDL 34.10  (L) 11/23/2016   LDLDIRECT 88.0 11/23/2016   LDLCALC 146 (H) 11/16/2013   ALT 16 11/23/2016   AST 19 11/23/2016   NA 139 11/23/2016   K 4.2 11/23/2016   CL 106 11/23/2016   CREATININE 1.17 11/23/2016   BUN 20 11/23/2016   CO2 25 11/23/2016   TSH 3.19 11/23/2016   PSA 0.50 11/23/2016    Dg Chest 2 View  Result Date: 11/27/2016 CLINICAL DATA:  Cough. EXAM: CHEST  2 VIEW COMPARISON:  Radiographs of August 23, 2014. FINDINGS: The heart size and mediastinal contours are within normal limits. Both lungs are clear. No pneumothorax or pleural effusion is noted. The visualized skeletal structures are unremarkable. IMPRESSION: No active cardiopulmonary disease. Electronically Signed   By: Marijo Conception, M.D.   On: 11/27/2016 13:11    Assessment & Plan:   There are no diagnoses linked to this encounter. I have discontinued David Hinton's SYRINGE-NEEDLE (DISP) 3 ML and Testosterone. I have also changed his omeprazole and irbesartan. Additionally, I am having him maintain his tadalafil, Vitamin D3, and ALPRAZolam.  Meds ordered this encounter  Medications  . omeprazole (PRILOSEC) 20 MG capsule    Sig: Take 2 capsules (40 mg total) by mouth daily.    Dispense:  60 capsule    Refill:  11  . irbesartan (AVAPRO) 300 MG tablet    Sig: Take 1 tablet (300 mg total) by mouth daily.    Dispense:  90 tablet    Refill:  3  . ALPRAZolam (XANAX) 0.5 MG tablet    Sig: take 1 tablet by mouth twice a day if needed for anxiety    Dispense:  60 tablet    Refill:  5     Follow-up: Return in about 4 months (around 01/06/2018) for a follow-up visit.  Walker Kehr, MD

## 2017-09-06 NOTE — Assessment & Plan Note (Signed)
Chronic  Xanax prn  Potential benefits of a long term benzodiazepines  use as well as potential risks  and complications were explained to the patient and were aknowledged. 

## 2017-09-06 NOTE — Assessment & Plan Note (Signed)
Not taking Vit D Re-start Vit D

## 2017-09-06 NOTE — Assessment & Plan Note (Signed)
Irbesartan

## 2017-09-07 ENCOUNTER — Other Ambulatory Visit (INDEPENDENT_AMBULATORY_CARE_PROVIDER_SITE_OTHER): Payer: BLUE CROSS/BLUE SHIELD

## 2017-09-07 DIAGNOSIS — E291 Testicular hypofunction: Secondary | ICD-10-CM | POA: Diagnosis not present

## 2017-09-07 LAB — CBC WITH DIFFERENTIAL/PLATELET
Basophils Absolute: 0 10*3/uL (ref 0.0–0.1)
Basophils Relative: 0.6 % (ref 0.0–3.0)
EOS PCT: 2 % (ref 0.0–5.0)
Eosinophils Absolute: 0.1 10*3/uL (ref 0.0–0.7)
HCT: 41.5 % (ref 39.0–52.0)
Hemoglobin: 14.3 g/dL (ref 13.0–17.0)
LYMPHS ABS: 1.4 10*3/uL (ref 0.7–4.0)
Lymphocytes Relative: 29.4 % (ref 12.0–46.0)
MCHC: 34.6 g/dL (ref 30.0–36.0)
MCV: 94.5 fl (ref 78.0–100.0)
MONOS PCT: 9.8 % (ref 3.0–12.0)
Monocytes Absolute: 0.5 10*3/uL (ref 0.1–1.0)
NEUTROS ABS: 2.8 10*3/uL (ref 1.4–7.7)
NEUTROS PCT: 58.2 % (ref 43.0–77.0)
PLATELETS: 212 10*3/uL (ref 150.0–400.0)
RBC: 4.39 Mil/uL (ref 4.22–5.81)
RDW: 13.2 % (ref 11.5–15.5)
WBC: 4.9 10*3/uL (ref 4.0–10.5)

## 2017-09-07 LAB — HEPATIC FUNCTION PANEL
ALBUMIN: 4.5 g/dL (ref 3.5–5.2)
ALT: 13 U/L (ref 0–53)
AST: 15 U/L (ref 0–37)
Alkaline Phosphatase: 62 U/L (ref 39–117)
Bilirubin, Direct: 0.1 mg/dL (ref 0.0–0.3)
Total Bilirubin: 0.5 mg/dL (ref 0.2–1.2)
Total Protein: 7.2 g/dL (ref 6.0–8.3)

## 2017-09-07 LAB — PSA: PSA: 0.61 ng/mL (ref 0.10–4.00)

## 2017-09-07 LAB — TESTOSTERONE: TESTOSTERONE: 265.31 ng/dL — AB (ref 300.00–890.00)

## 2017-11-01 DIAGNOSIS — I7300 Raynaud's syndrome without gangrene: Secondary | ICD-10-CM | POA: Diagnosis not present

## 2017-11-01 DIAGNOSIS — M79641 Pain in right hand: Secondary | ICD-10-CM | POA: Insufficient documentation

## 2017-11-01 DIAGNOSIS — M180 Bilateral primary osteoarthritis of first carpometacarpal joints: Secondary | ICD-10-CM | POA: Diagnosis not present

## 2017-11-01 DIAGNOSIS — M79642 Pain in left hand: Secondary | ICD-10-CM | POA: Diagnosis not present

## 2017-12-06 ENCOUNTER — Ambulatory Visit (INDEPENDENT_AMBULATORY_CARE_PROVIDER_SITE_OTHER): Payer: BLUE CROSS/BLUE SHIELD | Admitting: Internal Medicine

## 2017-12-06 ENCOUNTER — Telehealth: Payer: Self-pay | Admitting: Internal Medicine

## 2017-12-06 ENCOUNTER — Other Ambulatory Visit (INDEPENDENT_AMBULATORY_CARE_PROVIDER_SITE_OTHER): Payer: BLUE CROSS/BLUE SHIELD

## 2017-12-06 ENCOUNTER — Encounter: Payer: Self-pay | Admitting: Internal Medicine

## 2017-12-06 VITALS — BP 132/86 | HR 63 | Temp 98.6°F | Ht 71.0 in | Wt 179.0 lb

## 2017-12-06 DIAGNOSIS — I10 Essential (primary) hypertension: Secondary | ICD-10-CM

## 2017-12-06 DIAGNOSIS — W57XXXS Bitten or stung by nonvenomous insect and other nonvenomous arthropods, sequela: Secondary | ICD-10-CM

## 2017-12-06 DIAGNOSIS — E291 Testicular hypofunction: Secondary | ICD-10-CM | POA: Diagnosis not present

## 2017-12-06 DIAGNOSIS — E559 Vitamin D deficiency, unspecified: Secondary | ICD-10-CM | POA: Diagnosis not present

## 2017-12-06 DIAGNOSIS — Z0000 Encounter for general adult medical examination without abnormal findings: Secondary | ICD-10-CM | POA: Diagnosis not present

## 2017-12-06 DIAGNOSIS — J392 Other diseases of pharynx: Secondary | ICD-10-CM | POA: Insufficient documentation

## 2017-12-06 DIAGNOSIS — F411 Generalized anxiety disorder: Secondary | ICD-10-CM

## 2017-12-06 LAB — TESTOSTERONE: TESTOSTERONE: 227.7 ng/dL — AB (ref 300.00–890.00)

## 2017-12-06 LAB — VITAMIN D 25 HYDROXY (VIT D DEFICIENCY, FRACTURES): VITD: 37.98 ng/mL (ref 30.00–100.00)

## 2017-12-06 NOTE — Assessment & Plan Note (Signed)
labs

## 2017-12-06 NOTE — Assessment & Plan Note (Signed)
Labs

## 2017-12-06 NOTE — Telephone Encounter (Signed)
Pt forgot to ask for a referral to see a dermatologists. He would like a mole check. Please advise

## 2017-12-06 NOTE — Assessment & Plan Note (Signed)
Not using Rx

## 2017-12-06 NOTE — Progress Notes (Signed)
Subjective:  Patient ID: David Hinton, male    DOB: 05/23/1961  Age: 57 y.o. MRN: [id number]  CC: No chief complaint on file.   HPI David Hinton presents for anxiety, HTN Well exam C/o a "mole" in the throat  Outpatient Medications Prior to Visit  Medication Sig Dispense Refill  . ALPRAZolam (XANAX) 0.5 MG tablet take 1 tablet by mouth twice a day if needed for anxiety 60 tablet 5  . irbesartan (AVAPRO) 300 MG tablet Take 1 tablet (300 mg total) by mouth daily. 90 tablet 3  . omeprazole (PRILOSEC) 20 MG capsule Take 2 capsules (40 mg total) by mouth daily. 60 capsule 11  . tadalafil (CIALIS) 5 MG tablet Take 1 tablet (5 mg total) by mouth daily as needed (BPH). 30 tablet 3  . Cholecalciferol (VITAMIN D3) 2000 units capsule Take 1 capsule (2,000 Units total) by mouth daily. 100 capsule 3   No facility-administered medications prior to visit.     ROS: Review of Systems  Constitutional: Negative for appetite change, fatigue and unexpected weight change.  HENT: Negative for congestion, nosebleeds, sneezing, sore throat and trouble swallowing.   Eyes: Negative for itching and visual disturbance.  Respiratory: Negative for cough.   Cardiovascular: Negative for chest pain, palpitations and leg swelling.  Gastrointestinal: Negative for abdominal distention, blood in stool, diarrhea and nausea.  Genitourinary: Negative for frequency and hematuria.  Musculoskeletal: Positive for arthralgias. Negative for back pain, gait problem, joint swelling and neck pain.  Skin: Negative for rash.  Neurological: Negative for dizziness, tremors, speech difficulty and weakness.  Psychiatric/Behavioral: Negative for agitation, dysphoric mood and sleep disturbance. The patient is not nervous/anxious.     Objective:  BP 132/86 (BP Location: Left Arm, Patient Position: Sitting, Cuff Size: Normal)   Pulse 63   Temp 98.6 F (37 C) (Oral)   Ht 5\' 11"  (1.803 m)   Wt 179 lb (81.2 kg)   SpO2 99%    BMI 24.97 kg/m   BP Readings from Last 3 Encounters:  12/06/17 132/86  09/06/17 (!) 138/92  07/19/17 (!) 142/92    Wt Readings from Last 3 Encounters:  12/06/17 179 lb (81.2 kg)  09/06/17 184 lb (83.5 kg)  07/19/17 182 lb (82.6 kg)    Physical Exam  Constitutional: He is oriented to person, place, and time. He appears well-developed. No distress.  NAD  HENT:  Mouth/Throat: Oropharynx is clear and moist.  Eyes: Pupils are equal, round, and reactive to light. Conjunctivae are normal.  Neck: Normal range of motion. No JVD present. No thyromegaly present.  Cardiovascular: Normal rate, regular rhythm, normal heart sounds and intact distal pulses. Exam reveals no gallop and no friction rub.  No murmur heard. Pulmonary/Chest: Effort normal and breath sounds normal. No respiratory distress. He has no wheezes. He has no rales. He exhibits no tenderness.  Abdominal: Soft. Bowel sounds are normal. He exhibits no distension and no mass. There is no tenderness. There is no rebound and no guarding.  Musculoskeletal: Normal range of motion. He exhibits no edema or tenderness.  Lymphadenopathy:    He has no cervical adenopathy.  Neurological: He is alert and oriented to person, place, and time. He has normal reflexes. No cranial nerve deficit. He exhibits normal muscle tone. He displays a negative Romberg sign. Coordination and gait normal.  Skin: Skin is warm and dry. No rash noted.  Psychiatric: He has a normal mood and affect. His behavior is normal. Judgment and thought content normal.  3 mm cherry red spot on L soft palate  Pt declined a rectal exam  Lab Results  Component Value Date   WBC 4.9 09/07/2017   HGB 14.3 09/07/2017   HCT 41.5 09/07/2017   PLT 212.0 09/07/2017   GLUCOSE 84 11/23/2016   CHOL 176 11/23/2016   TRIG 366.0 (H) 11/23/2016   HDL 34.10 (L) 11/23/2016   LDLDIRECT 88.0 11/23/2016   LDLCALC 146 (H) 11/16/2013   ALT 13 09/07/2017   AST 15 09/07/2017   NA 139  11/23/2016   K 4.2 11/23/2016   CL 106 11/23/2016   CREATININE 1.17 11/23/2016   BUN 20 11/23/2016   CO2 25 11/23/2016   TSH 3.19 11/23/2016   PSA 0.61 09/07/2017    Dg Chest 2 View  Result Date: 11/27/2016 CLINICAL DATA:  Cough. EXAM: CHEST  2 VIEW COMPARISON:  Radiographs of August 23, 2014. FINDINGS: The heart size and mediastinal contours are within normal limits. Both lungs are clear. No pneumothorax or pleural effusion is noted. The visualized skeletal structures are unremarkable. IMPRESSION: No active cardiopulmonary disease. Electronically Signed   By: Marijo Conception, M.D.   On: 11/27/2016 13:11    Assessment & Plan:   There are no diagnoses linked to this encounter.   No orders of the defined types were placed in this encounter.    Follow-up: No follow-ups on file.  Walker Kehr, MD

## 2017-12-06 NOTE — Assessment & Plan Note (Signed)
We discussed age appropriate health related issues, including available/recomended screening tests and vaccinations. We discussed a need for adhering to healthy diet and exercise. Labs were ordered to be later reviewed . All questions were answered.   

## 2017-12-06 NOTE — Assessment & Plan Note (Signed)
ENT ref - Dr Constance Holster 3 mm cherry red spot on L soft palate

## 2017-12-06 NOTE — Assessment & Plan Note (Signed)
Xanax prn  Potential benefits of a long term benzodiazepines  use as well as potential risks  and complications were explained to the patient and were aknowledged. 

## 2017-12-06 NOTE — Assessment & Plan Note (Signed)
Irbesartan

## 2017-12-07 ENCOUNTER — Other Ambulatory Visit: Payer: Self-pay | Admitting: Internal Medicine

## 2017-12-07 DIAGNOSIS — D485 Neoplasm of uncertain behavior of skin: Secondary | ICD-10-CM

## 2017-12-07 NOTE — Telephone Encounter (Signed)
Ok Thx 

## 2017-12-08 NOTE — Telephone Encounter (Signed)
Called pt no answer LMOM MD place referral for dermatology.Marland KitchenJohny Hinton

## 2017-12-09 DIAGNOSIS — K118 Other diseases of salivary glands: Secondary | ICD-10-CM | POA: Diagnosis not present

## 2017-12-09 DIAGNOSIS — J392 Other diseases of pharynx: Secondary | ICD-10-CM | POA: Diagnosis not present

## 2017-12-24 ENCOUNTER — Other Ambulatory Visit: Payer: Self-pay | Admitting: *Deleted

## 2017-12-24 DIAGNOSIS — H1132 Conjunctival hemorrhage, left eye: Secondary | ICD-10-CM | POA: Diagnosis not present

## 2017-12-24 MED ORDER — TADALAFIL 5 MG PO TABS: 5.0000 mg | ORAL_TABLET | Freq: Every day | ORAL | 3 refills | Status: DC | PRN

## 2018-01-03 DIAGNOSIS — I7300 Raynaud's syndrome without gangrene: Secondary | ICD-10-CM | POA: Diagnosis not present

## 2018-01-03 DIAGNOSIS — R5383 Other fatigue: Secondary | ICD-10-CM | POA: Diagnosis not present

## 2018-01-03 DIAGNOSIS — M150 Primary generalized (osteo)arthritis: Secondary | ICD-10-CM | POA: Diagnosis not present

## 2018-03-14 ENCOUNTER — Ambulatory Visit: Payer: BLUE CROSS/BLUE SHIELD | Admitting: Internal Medicine

## 2018-03-14 DIAGNOSIS — Z0289 Encounter for other administrative examinations: Secondary | ICD-10-CM

## 2018-04-21 ENCOUNTER — Other Ambulatory Visit: Payer: Self-pay

## 2018-04-25 ENCOUNTER — Encounter: Payer: Self-pay | Admitting: Internal Medicine

## 2018-04-25 DIAGNOSIS — D225 Melanocytic nevi of trunk: Secondary | ICD-10-CM | POA: Diagnosis not present

## 2018-04-25 DIAGNOSIS — D1801 Hemangioma of skin and subcutaneous tissue: Secondary | ICD-10-CM | POA: Diagnosis not present

## 2018-04-25 DIAGNOSIS — C44519 Basal cell carcinoma of skin of other part of trunk: Secondary | ICD-10-CM | POA: Diagnosis not present

## 2018-04-25 DIAGNOSIS — A630 Anogenital (venereal) warts: Secondary | ICD-10-CM | POA: Diagnosis not present

## 2018-04-25 DIAGNOSIS — L814 Other melanin hyperpigmentation: Secondary | ICD-10-CM | POA: Diagnosis not present

## 2018-04-25 MED ORDER — ALPRAZOLAM 0.5 MG PO TABS: ORAL_TABLET | ORAL | 5 refills | Status: DC

## 2018-04-25 NOTE — Telephone Encounter (Signed)
This is not my patient and I have never seen him

## 2018-04-25 NOTE — Telephone Encounter (Signed)
Routed incorrectly,  °

## 2018-04-25 NOTE — Telephone Encounter (Signed)
Pt called to check on the status of this request.  He would like to speak to someone today about this since he is out of medication.

## 2018-04-25 NOTE — Telephone Encounter (Signed)
Patient is calling back stating the pharmacy is getting no response for ALPRAZolam . Please advise

## 2018-04-25 NOTE — Addendum Note (Signed)
Addended by: Karren Cobble on: 04/25/2018 04:55 PM   Modules accepted: Orders

## 2018-05-05 DIAGNOSIS — C44519 Basal cell carcinoma of skin of other part of trunk: Secondary | ICD-10-CM | POA: Diagnosis not present

## 2018-07-07 DIAGNOSIS — I7300 Raynaud's syndrome without gangrene: Secondary | ICD-10-CM | POA: Diagnosis not present

## 2018-07-07 DIAGNOSIS — M150 Primary generalized (osteo)arthritis: Secondary | ICD-10-CM | POA: Diagnosis not present

## 2018-09-23 ENCOUNTER — Other Ambulatory Visit: Payer: Self-pay | Admitting: Internal Medicine

## 2018-09-30 IMAGING — DX DG CHEST 2V
2 series · 2 of 2 positions shown · non-contrast
Comparison: Radiographs August 23, 2014.

CLINICAL DATA: Cough.

EXAM:
CHEST  2 VIEW

[chest pa]
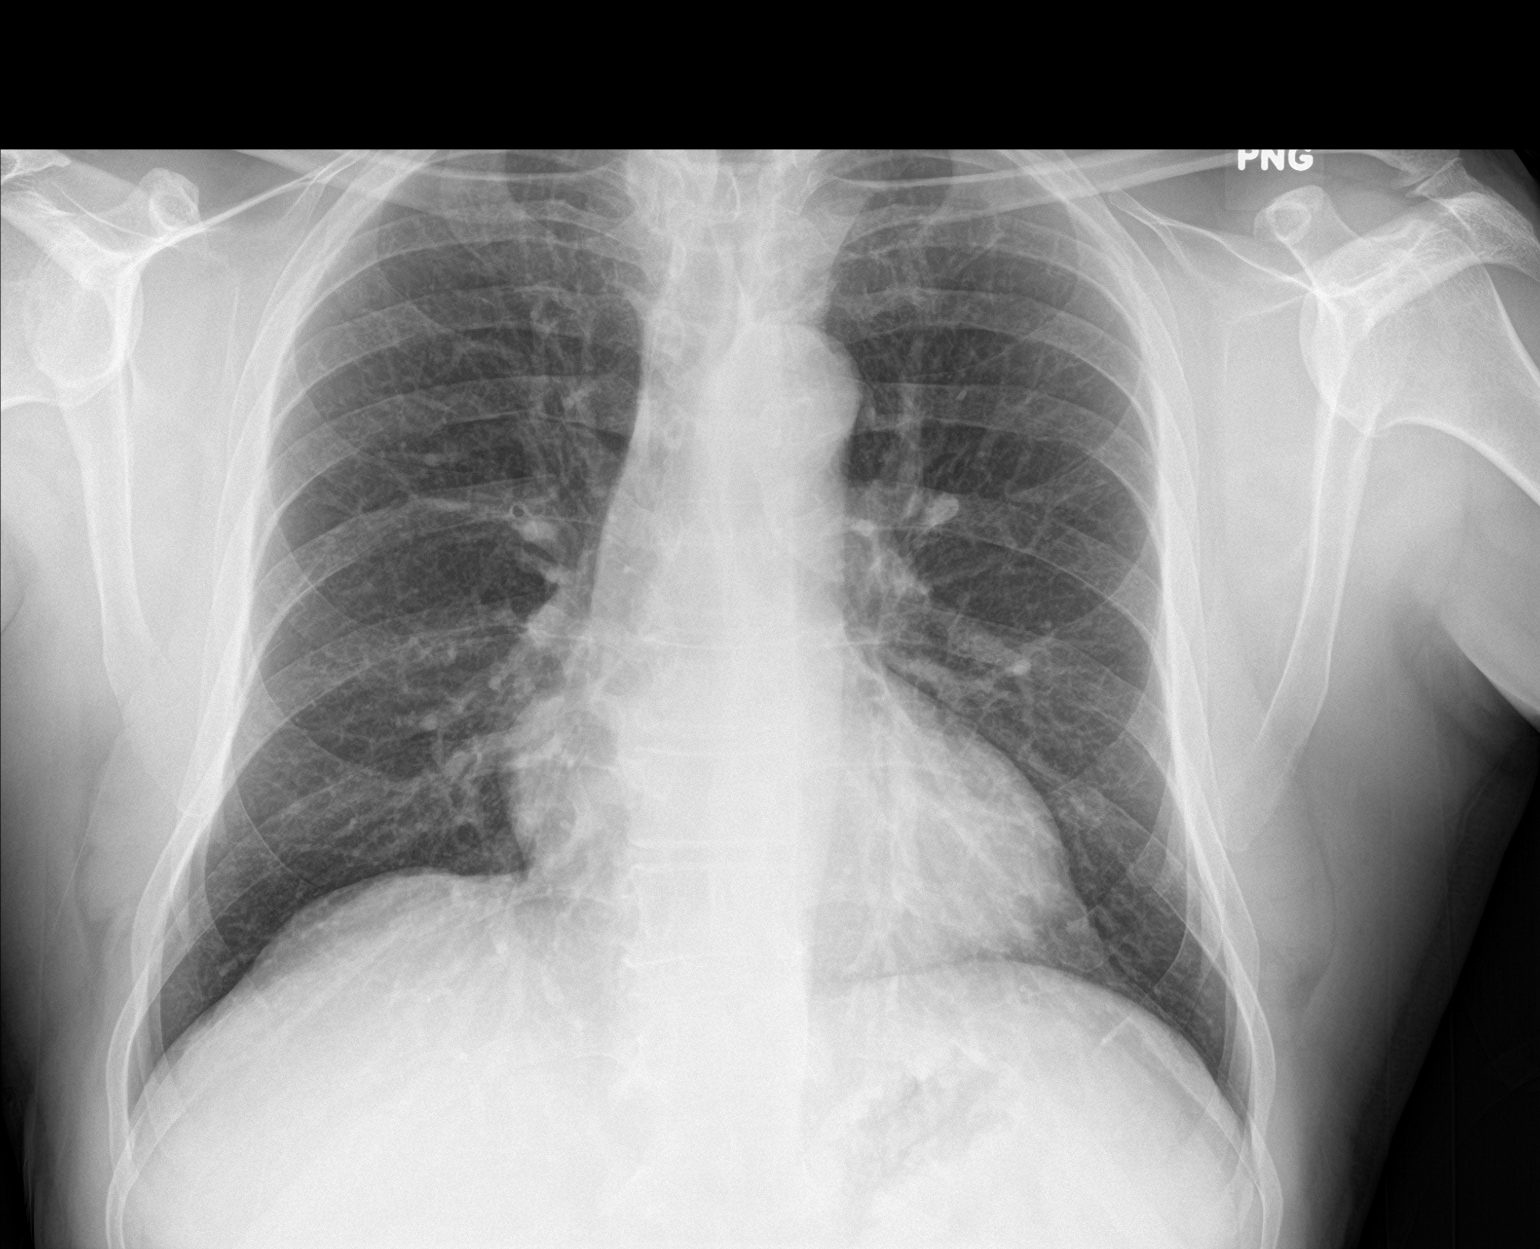

[chest lat]
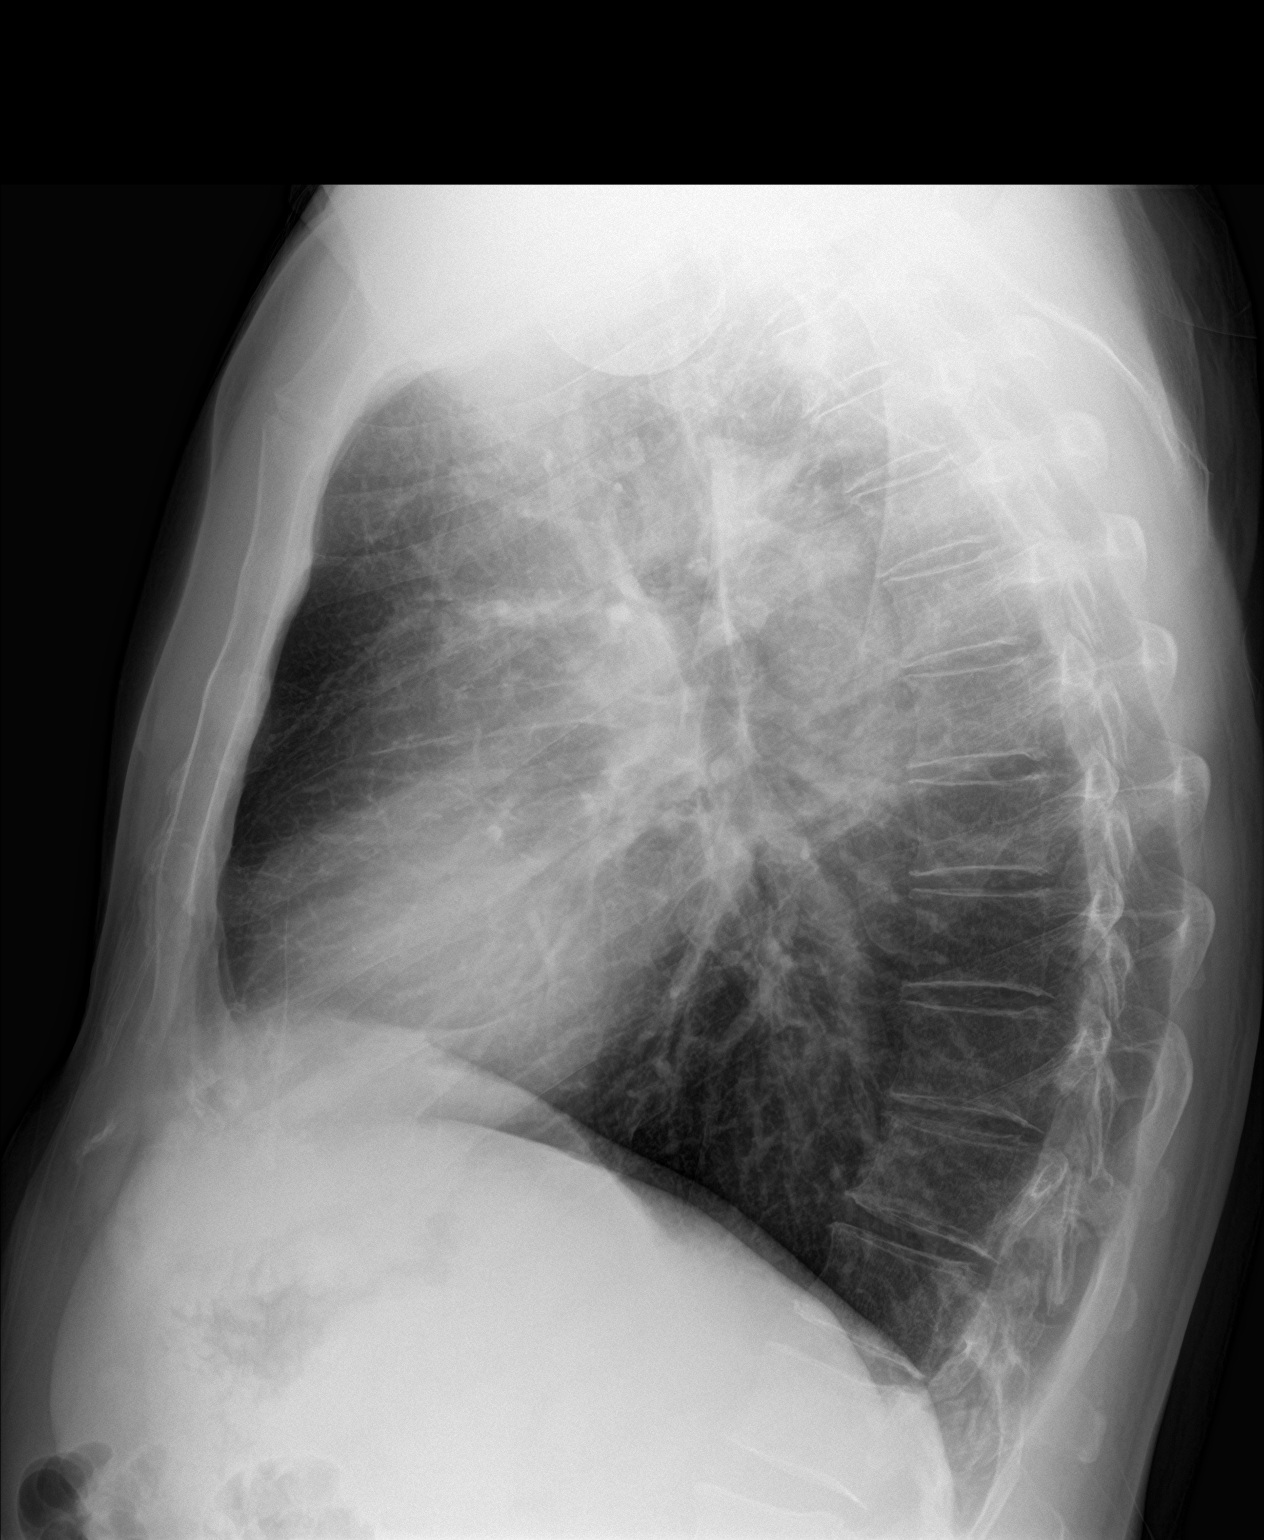

[2 of 2 positions shown; findings below may reference images not displayed]

FINDINGS: The heart size and mediastinal contours are within normal limits.
Both lungs are clear. No pneumothorax or pleural effusion is noted.
The visualized skeletal structures are unremarkable.
IMPRESSION: No active cardiopulmonary disease.

## 2018-10-27 ENCOUNTER — Other Ambulatory Visit: Payer: Self-pay | Admitting: Internal Medicine

## 2018-10-27 NOTE — Telephone Encounter (Signed)
Pt also needs a refill on irbesartan (AVAPRO) 300 MG tablet  And has called back to see if his Alprazolam was sent to pharmacy/ please advise

## 2018-10-27 NOTE — Telephone Encounter (Signed)
Patient stated he is completely out of his  ALPRAZolam David Hinton) 0.5 MG tablet  medication and need it sent to his preferred pharmacy [facility] on [address] as soon as possible.

## 2018-10-28 MED ORDER — IRBESARTAN 300 MG PO TABS: 300.0000 mg | ORAL_TABLET | Freq: Every day | ORAL | 0 refills | Status: DC

## 2018-10-28 NOTE — Addendum Note (Signed)
Addended by: Earnstine Regal on: 10/28/2018 11:05 AM   Modules accepted: Orders

## 2018-10-28 NOTE — Telephone Encounter (Signed)
Per chart MD approved alprazolam on yesterday has been sent to [facility]. Sent 30 day supply in for the irbesartan. Pt is due for annual appt next month.Marland KitchenJohny Hinton

## 2019-05-23 DIAGNOSIS — D2261 Melanocytic nevi of right upper limb, including shoulder: Secondary | ICD-10-CM | POA: Diagnosis not present

## 2019-05-23 DIAGNOSIS — C44319 Basal cell carcinoma of skin of other parts of face: Secondary | ICD-10-CM | POA: Diagnosis not present

## 2019-05-23 DIAGNOSIS — D2272 Melanocytic nevi of left lower limb, including hip: Secondary | ICD-10-CM | POA: Diagnosis not present

## 2019-05-23 DIAGNOSIS — D225 Melanocytic nevi of trunk: Secondary | ICD-10-CM | POA: Diagnosis not present

## 2019-05-23 DIAGNOSIS — Z85828 Personal history of other malignant neoplasm of skin: Secondary | ICD-10-CM | POA: Diagnosis not present

## 2019-06-05 DIAGNOSIS — D224 Melanocytic nevi of scalp and neck: Secondary | ICD-10-CM | POA: Diagnosis not present

## 2019-06-26 DIAGNOSIS — C44319 Basal cell carcinoma of skin of other parts of face: Secondary | ICD-10-CM | POA: Diagnosis not present

## 2019-06-27 DIAGNOSIS — Z20828 Contact with and (suspected) exposure to other viral communicable diseases: Secondary | ICD-10-CM | POA: Diagnosis not present

## 2019-06-27 DIAGNOSIS — I10 Essential (primary) hypertension: Secondary | ICD-10-CM | POA: Diagnosis not present

## 2019-07-04 ENCOUNTER — Ambulatory Visit (INDEPENDENT_AMBULATORY_CARE_PROVIDER_SITE_OTHER): Payer: BC Managed Care – PPO | Admitting: Internal Medicine

## 2019-07-04 ENCOUNTER — Encounter: Payer: Self-pay | Admitting: Internal Medicine

## 2019-07-04 ENCOUNTER — Other Ambulatory Visit: Payer: Self-pay

## 2019-07-04 VITALS — BP 156/98 | HR 72 | Temp 97.8°F | Ht 71.0 in | Wt 172.0 lb

## 2019-07-04 DIAGNOSIS — W57XXXS Bitten or stung by nonvenomous insect and other nonvenomous arthropods, sequela: Secondary | ICD-10-CM

## 2019-07-04 DIAGNOSIS — I10 Essential (primary) hypertension: Secondary | ICD-10-CM | POA: Diagnosis not present

## 2019-07-04 DIAGNOSIS — F341 Dysthymic disorder: Secondary | ICD-10-CM

## 2019-07-04 DIAGNOSIS — Z0000 Encounter for general adult medical examination without abnormal findings: Secondary | ICD-10-CM | POA: Diagnosis not present

## 2019-07-04 DIAGNOSIS — F411 Generalized anxiety disorder: Secondary | ICD-10-CM

## 2019-07-04 DIAGNOSIS — E291 Testicular hypofunction: Secondary | ICD-10-CM

## 2019-07-04 DIAGNOSIS — E559 Vitamin D deficiency, unspecified: Secondary | ICD-10-CM | POA: Diagnosis not present

## 2019-07-04 DIAGNOSIS — D649 Anemia, unspecified: Secondary | ICD-10-CM | POA: Diagnosis not present

## 2019-07-04 DIAGNOSIS — K219 Gastro-esophageal reflux disease without esophagitis: Secondary | ICD-10-CM

## 2019-07-04 LAB — PSA: PSA: 0.53 ng/mL (ref 0.10–4.00)

## 2019-07-04 LAB — LIPID PANEL
Cholesterol: 217 mg/dL — ABNORMAL HIGH (ref 0–200)
HDL: 46.6 mg/dL (ref >39.00)
LDL Cholesterol: 140 mg/dL — ABNORMAL HIGH (ref 0–99)
NonHDL: 170.54
Total CHOL/HDL Ratio: 5
Triglycerides: 154 mg/dL — ABNORMAL HIGH (ref 0.0–149.0)
VLDL: 30.8 mg/dL (ref 0.0–40.0)

## 2019-07-04 LAB — HEPATIC FUNCTION PANEL
ALT: 14 U/L (ref 0–53)
AST: 15 U/L (ref 0–37)
Albumin: 4.6 g/dL (ref 3.5–5.2)
Alkaline Phosphatase: 67 U/L (ref 39–117)
Bilirubin, Direct: 0.1 mg/dL (ref 0.0–0.3)
Total Bilirubin: 0.8 mg/dL (ref 0.2–1.2)
Total Protein: 7.3 g/dL (ref 6.0–8.3)

## 2019-07-04 LAB — BASIC METABOLIC PANEL
BUN: 11 mg/dL (ref 6–23)
CO2: 28 mEq/L (ref 19–32)
Calcium: 10.1 mg/dL (ref 8.4–10.5)
Chloride: 101 mEq/L (ref 96–112)
Creatinine, Ser: 1.14 mg/dL (ref 0.40–1.50)
GFR: 65.82 mL/min (ref >60.00)
Glucose, Bld: 99 mg/dL (ref 70–99)
Potassium: 4.3 mEq/L (ref 3.5–5.1)
Sodium: 138 mEq/L (ref 135–145)

## 2019-07-04 LAB — CBC WITH DIFFERENTIAL/PLATELET
Basophils Absolute: 0 10*3/uL (ref 0.0–0.1)
Basophils Relative: 0.5 % (ref 0.0–3.0)
Eosinophils Absolute: 0.1 10*3/uL (ref 0.0–0.7)
Eosinophils Relative: 0.9 % (ref 0.0–5.0)
HCT: 46.3 % (ref 39.0–52.0)
Hemoglobin: 15.3 g/dL (ref 13.0–17.0)
Lymphocytes Relative: 19.9 % (ref 12.0–46.0)
Lymphs Abs: 1.7 10*3/uL (ref 0.7–4.0)
MCHC: 33.1 g/dL (ref 30.0–36.0)
MCV: 96.5 fl (ref 78.0–100.0)
Monocytes Absolute: 0.8 10*3/uL (ref 0.1–1.0)
Monocytes Relative: 9.4 % (ref 3.0–12.0)
Neutro Abs: 5.9 10*3/uL (ref 1.4–7.7)
Neutrophils Relative %: 69.3 % (ref 43.0–77.0)
Platelets: 257 10*3/uL (ref 150.0–400.0)
RBC: 4.8 Mil/uL (ref 4.22–5.81)
RDW: 13 % (ref 11.5–15.5)
WBC: 8.5 10*3/uL (ref 4.0–10.5)

## 2019-07-04 LAB — VITAMIN B12: Vitamin B-12: 333 pg/mL (ref 211–911)

## 2019-07-04 LAB — TSH: TSH: 1.26 u[IU]/mL (ref 0.35–4.50)

## 2019-07-04 LAB — TESTOSTERONE: Testosterone: 170.34 ng/dL — ABNORMAL LOW (ref 300.00–890.00)

## 2019-07-04 MED ORDER — ALPRAZOLAM 0.5 MG PO TABS: ORAL_TABLET | ORAL | 1 refills | Status: DC

## 2019-07-04 MED ORDER — ESCITALOPRAM OXALATE 5 MG PO TABS: 5.0000 mg | ORAL_TABLET | Freq: Every day | ORAL | 1 refills | Status: DC

## 2019-07-04 MED ORDER — AMLODIPINE BESYLATE 5 MG PO TABS: 5.0000 mg | ORAL_TABLET | Freq: Every day | ORAL | 3 refills | Status: DC

## 2019-07-04 MED ORDER — IRBESARTAN 300 MG PO TABS: 300.0000 mg | ORAL_TABLET | Freq: Every day | ORAL | 3 refills | Status: DC

## 2019-07-04 NOTE — Patient Instructions (Addendum)
"  The Power of Now" by [employer] up for [employer] ( via [employer] on your phone or your ipad). If you don't have a Art therapist card  - go to [employer] branch. They will set you up in 15 minutes. It is free. You can check out books to read and to listen, check out magazines and newspapers, movies etc.

## 2019-07-04 NOTE — Assessment & Plan Note (Signed)
Xanax prn  Potential benefits of a long term benzodiazepines  use as well as potential risks  and complications were explained to the patient and were aknowledged. 

## 2019-07-04 NOTE — Assessment & Plan Note (Signed)
CBC

## 2019-07-04 NOTE — Assessment & Plan Note (Signed)
BP Readings from Last 3 Encounters:  07/04/19 (!) 156/98  12/06/17 132/86  09/06/17 (!) 138/92

## 2019-07-04 NOTE — Assessment & Plan Note (Signed)
Labs

## 2019-07-04 NOTE — Progress Notes (Signed)
Subjective:  Patient ID: David Hinton, male    DOB: 12-31-1960  Age: 59 y.o. MRN: [id number]  CC: No chief complaint on file.   HPI David Hinton presents for HTN, anxiety, insomnia f/u  BP was high - ran out of meds Dr. 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  It has been a lot of work.  He has one older man who helps him with restoration. He has been eating out more.  He has been under more stress.  He broke up with his girlfriend.  He is concerned weight loss and muscle wasting.  His concern of tick bites that he was getting in the warmer time of the year.  He is concerned of the lead toxicity  Outpatient Medications Prior to Visit  Medication Sig Dispense Refill  . ALPRAZolam (XANAX) 0.5 MG tablet TAKE 1 TABLET BY MOUTH TWICE DAILY IF NEEDED FOR ANXIETY (Patient not taking: Reported on 07/04/2019) 60 tablet 1  . irbesartan (AVAPRO) 300 MG tablet Take 1 tablet (300 mg total) by mouth daily. Annual appt due June must see provider for future refills (Patient not taking: Reported on 07/04/2019) 30 tablet 0  . omeprazole (PRILOSEC) 20 MG capsule Take 2 capsules (40 mg total) by mouth daily. (Patient not taking: Reported on 07/04/2019) 60 capsule 11  . tadalafil (CIALIS) 5 MG tablet Take 1 tablet (5 mg total) by mouth daily as needed (BPH). (Patient not taking: Reported on 07/04/2019) 30 tablet 3   No facility-administered medications prior to visit.    ROS: Review of Systems  Constitutional: Positive for fatigue and unexpected weight change. Negative for appetite change.  HENT: Negative for congestion, nosebleeds, sneezing, sore throat and trouble swallowing.   Eyes: Negative for itching and visual disturbance.  Respiratory: Negative for cough.   Cardiovascular: Negative for chest pain, palpitations and leg swelling.  Gastrointestinal: Negative for abdominal distention, blood in stool, diarrhea and nausea.  Genitourinary: Negative for frequency and hematuria.  Musculoskeletal:  Negative for back pain, gait problem, joint swelling and neck pain.  Skin: Negative for rash.  Neurological: Negative for dizziness, tremors, speech difficulty and weakness.  Psychiatric/Behavioral: Positive for sleep disturbance. Negative for agitation, dysphoric mood and suicidal ideas. The patient is nervous/anxious.     Objective:  BP (!) 156/98 (BP Location: Left Arm, Patient Position: Sitting, Cuff Size: Normal)   Pulse 72   Temp 97.8 F (36.6 C) (Oral)   Ht 5\' 11"  (1.803 m)   Wt 172 lb (78 kg)   SpO2 97%   BMI 23.99 kg/m   BP Readings from Last 3 Encounters:  07/04/19 (!) 156/98  12/06/17 132/86  09/06/17 (!) 138/92    Wt Readings from Last 3 Encounters:  07/04/19 172 lb (78 kg)  12/06/17 179 lb (81.2 kg)  09/06/17 184 lb (83.5 kg)    Physical Exam Constitutional:      General: He is not in acute distress.    Appearance: Normal appearance. He is well-developed.     Comments: NAD  Eyes:     Conjunctiva/sclera: Conjunctivae normal.     Pupils: Pupils are equal, round, and reactive to light.  Neck:     Thyroid: No thyromegaly.     Vascular: No JVD.  Cardiovascular:     Rate and Rhythm: Normal rate and regular rhythm.     Heart sounds: Normal heart sounds. No murmur. No friction rub. No gallop.   Pulmonary:     Effort: Pulmonary effort is  normal. No respiratory distress.     Breath sounds: Normal breath sounds. No wheezing or rales.  Chest:     Chest wall: No tenderness.  Abdominal:     General: Bowel sounds are normal. There is no distension.     Palpations: Abdomen is soft. There is no mass.     Tenderness: There is no abdominal tenderness. There is no guarding or rebound.  Musculoskeletal:        General: No tenderness. Normal range of motion.     Cervical back: Normal range of motion.  Lymphadenopathy:     Cervical: No cervical adenopathy.  Skin:    General: Skin is warm and dry.     Findings: No rash.  Neurological:     Mental Status: He is alert  and oriented to person, place, and time.     Cranial Nerves: No cranial nerve deficit.     Motor: No abnormal muscle tone.     Coordination: Coordination normal.     Gait: Gait normal.     Deep Tendon Reflexes: Reflexes are normal and symmetric.  Psychiatric:        Behavior: Behavior normal.        Thought Content: Thought content normal.        Judgment: Judgment normal.   A complex case >45 min There is no muscle atrophy or abnormal muscle movements noted Lab Results  Component Value Date   WBC 4.9 09/07/2017   HGB 14.3 09/07/2017   HCT 41.5 09/07/2017   PLT 212.0 09/07/2017   GLUCOSE 84 11/23/2016   CHOL 176 11/23/2016   TRIG 366.0 (H) 11/23/2016   HDL 34.10 (L) 11/23/2016   LDLDIRECT 88.0 11/23/2016   LDLCALC 146 (H) 11/16/2013   ALT 13 09/07/2017   AST 15 09/07/2017   NA 139 11/23/2016   K 4.2 11/23/2016   CL 106 11/23/2016   CREATININE 1.17 11/23/2016   BUN 20 11/23/2016   CO2 25 11/23/2016   TSH 3.19 11/23/2016   PSA 0.61 09/07/2017    DG Chest 2 View  Result Date: 11/27/2016 CLINICAL DATA:  Cough. EXAM: CHEST  2 VIEW COMPARISON:  Radiographs of August 23, 2014. FINDINGS: The heart size and mediastinal contours are within normal limits. Both lungs are clear. No pneumothorax or pleural effusion is noted. The visualized skeletal structures are unremarkable. IMPRESSION: No active cardiopulmonary disease. Electronically Signed   By: Marijo Conception, M.D.   On: 11/27/2016 13:11    Assessment & Plan:   There are no diagnoses linked to this encounter.   No orders of the defined types were placed in this encounter.    Follow-up: No follow-ups on file.  Walker Kehr, MD

## 2019-07-04 NOTE — Assessment & Plan Note (Signed)
Stress discussed 

## 2019-07-04 NOTE — Assessment & Plan Note (Signed)
Omeprazole

## 2019-07-04 NOTE — Assessment & Plan Note (Signed)
Vit D 

## 2019-07-05 LAB — URINALYSIS
Bilirubin Urine: NEGATIVE
Hgb urine dipstick: NEGATIVE
Ketones, ur: NEGATIVE
Leukocytes,Ua: NEGATIVE
Nitrite: NEGATIVE
Specific Gravity, Urine: 1.01 (ref 1.000–1.030)
Total Protein, Urine: NEGATIVE
Urine Glucose: NEGATIVE
Urobilinogen, UA: 0.2 (ref 0.0–1.0)
pH: 7 (ref 5.0–8.0)

## 2019-07-05 LAB — VITAMIN D 25 HYDROXY (VIT D DEFICIENCY, FRACTURES): VITD: 25.62 ng/mL — ABNORMAL LOW (ref 30.00–100.00)

## 2019-07-06 ENCOUNTER — Other Ambulatory Visit: Payer: Self-pay | Admitting: Internal Medicine

## 2019-07-06 MED ORDER — VITAMIN D3 1.25 MG (50000 UT) PO CAPS: 1.0000 | ORAL_CAPSULE | ORAL | 0 refills | Status: DC

## 2019-07-06 MED ORDER — VITAMIN D3 50 MCG (2000 UT) PO CAPS: 2000.0000 [IU] | ORAL_CAPSULE | Freq: Every day | ORAL | 3 refills | Status: AC

## 2019-07-07 LAB — ROCKY MTN SPOTTED FVR ABS PNL(IGG+IGM)
RMSF IgG: NOT DETECTED
RMSF IgM: NOT DETECTED

## 2019-07-07 LAB — LEAD, BLOOD (ADULT >= 16 YRS): Lead: 7 ug/dL — ABNORMAL HIGH (ref <5)

## 2019-07-11 LAB — LYME DISEASE, WESTERN BLOT
IgG P18 Ab.: ABSENT
IgG P23 Ab.: ABSENT
IgG P28 Ab.: ABSENT
IgG P30 Ab.: ABSENT
IgG P39 Ab.: ABSENT
IgG P41 Ab.: ABSENT
IgG P45 Ab.: ABSENT
IgG P58 Ab.: ABSENT
IgG P66 Ab.: ABSENT
IgG P93 Ab.: ABSENT
IgM P23 Ab.: ABSENT
IgM P39 Ab.: ABSENT
IgM P41 Ab.: ABSENT
Lyme IgG Wb: NEGATIVE
Lyme IgM Wb: NEGATIVE

## 2019-09-20 ENCOUNTER — Other Ambulatory Visit: Payer: Self-pay | Admitting: Internal Medicine

## 2019-11-17 ENCOUNTER — Other Ambulatory Visit: Payer: Self-pay | Admitting: Internal Medicine

## 2019-11-17 NOTE — Telephone Encounter (Signed)
Patient came by office requesting this refill. He is worried about running out over the weekend.  Patient informed provider is out of office but a message will be sent back.

## 2019-11-17 NOTE — Telephone Encounter (Signed)
Check  registry last filled 09/20/2019.Marland KitchenJohny Hinton

## 2019-12-06 ENCOUNTER — Encounter: Payer: Self-pay | Admitting: Internal Medicine

## 2019-12-15 ENCOUNTER — Ambulatory Visit (INDEPENDENT_AMBULATORY_CARE_PROVIDER_SITE_OTHER): Payer: BC Managed Care – PPO | Admitting: Internal Medicine

## 2019-12-15 ENCOUNTER — Other Ambulatory Visit: Payer: Self-pay

## 2019-12-15 ENCOUNTER — Encounter: Payer: Self-pay | Admitting: Internal Medicine

## 2019-12-15 VITALS — BP 122/84 | HR 69 | Temp 98.4°F | Ht 71.0 in | Wt 167.0 lb

## 2019-12-15 DIAGNOSIS — R5383 Other fatigue: Secondary | ICD-10-CM | POA: Diagnosis not present
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
## 2019-12-15 NOTE — Assessment & Plan Note (Signed)
Checking labs today which may be relevant to his fatigue. I do think that there is a significant emotional component to his symptoms. He does not want to get on a daily medication at this time due to not remembering. Advised to continue with his daily yoga, strongly consider counseling and follow up closely with his PCP. Adjust as needed based on labs which will be need to be done prior to 9 AM.

## 2019-12-15 NOTE — Patient Instructions (Signed)
We will check the labs before 9AM so come back one day next week to get this done.

## 2019-12-18 ENCOUNTER — Other Ambulatory Visit: Payer: Self-pay

## 2019-12-18 ENCOUNTER — Other Ambulatory Visit (INDEPENDENT_AMBULATORY_CARE_PROVIDER_SITE_OTHER): Payer: BC Managed Care – PPO

## 2019-12-18 DIAGNOSIS — R5383 Other fatigue: Secondary | ICD-10-CM

## 2019-12-18 LAB — COMPREHENSIVE METABOLIC PANEL
ALT: 12 U/L (ref 0–53)
AST: 14 U/L (ref 0–37)
Albumin: 4.5 g/dL (ref 3.5–5.2)
Alkaline Phosphatase: 57 U/L (ref 39–117)
BUN: 25 mg/dL — ABNORMAL HIGH (ref 6–23)
CO2: 28 mEq/L (ref 19–32)
Calcium: 10 mg/dL (ref 8.4–10.5)
Chloride: 100 mEq/L (ref 96–112)
Creatinine, Ser: 1.24 mg/dL (ref 0.40–1.50)
GFR: 59.64 mL/min — ABNORMAL LOW (ref >60.00)
Glucose, Bld: 103 mg/dL — ABNORMAL HIGH (ref 70–99)
Potassium: 4.4 mEq/L (ref 3.5–5.1)
Sodium: 137 mEq/L (ref 135–145)
Total Bilirubin: 0.4 mg/dL (ref 0.2–1.2)
Total Protein: 6.9 g/dL (ref 6.0–8.3)

## 2019-12-18 LAB — CBC
HCT: 39.4 % (ref 39.0–52.0)
Hemoglobin: 13.7 g/dL (ref 13.0–17.0)
MCHC: 34.8 g/dL (ref 30.0–36.0)
MCV: 95.7 fl (ref 78.0–100.0)
Platelets: 212 10*3/uL (ref 150.0–400.0)
RBC: 4.12 Mil/uL — ABNORMAL LOW (ref 4.22–5.81)
RDW: 12.1 % (ref 11.5–15.5)
WBC: 5.3 10*3/uL (ref 4.0–10.5)

## 2019-12-18 LAB — VITAMIN D 25 HYDROXY (VIT D DEFICIENCY, FRACTURES): VITD: 43.6 ng/mL (ref 30.00–100.00)

## 2019-12-18 LAB — VITAMIN B12: Vitamin B-12: 427 pg/mL (ref 211–911)

## 2019-12-18 LAB — CORTISOL: Cortisol, Plasma: 21 ug/dL

## 2019-12-18 LAB — TSH: TSH: 1.38 u[IU]/mL (ref 0.35–4.50)

## 2019-12-18 LAB — HEMOGLOBIN A1C: Hgb A1c MFr Bld: 5.8 % (ref 4.6–6.5)

## 2019-12-19 LAB — TESTOSTERONE TOTAL,FREE,BIO, MALES
Albumin: 4.3 g/dL (ref 3.6–5.1)
Sex Hormone Binding: 17 nmol/L — ABNORMAL LOW (ref 22–77)
Testosterone, Bioavailable: 115 ng/dL (ref >=110.0)
Testosterone, Free: 58.4 pg/mL (ref 46.0–224.0)
Testosterone: 280 ng/dL (ref 250–827)

## 2019-12-19 LAB — HEPATITIS C ANTIBODY
Hepatitis C Ab: NONREACTIVE
SIGNAL TO CUT-OFF: 0.01 (ref <1.00)

## 2019-12-22 DIAGNOSIS — C4491 Basal cell carcinoma of skin, unspecified: Secondary | ICD-10-CM | POA: Insufficient documentation

## 2019-12-22 DIAGNOSIS — G4709 Other insomnia: Secondary | ICD-10-CM | POA: Diagnosis not present

## 2019-12-22 DIAGNOSIS — R5382 Chronic fatigue, unspecified: Secondary | ICD-10-CM | POA: Diagnosis not present

## 2019-12-22 DIAGNOSIS — F439 Reaction to severe stress, unspecified: Secondary | ICD-10-CM | POA: Diagnosis not present

## 2019-12-22 DIAGNOSIS — R7989 Other specified abnormal findings of blood chemistry: Secondary | ICD-10-CM | POA: Insufficient documentation

## 2019-12-22 DIAGNOSIS — M5090 Cervical disc disorder, unspecified, unspecified cervical region: Secondary | ICD-10-CM | POA: Insufficient documentation

## 2020-01-01 ENCOUNTER — Ambulatory Visit (INDEPENDENT_AMBULATORY_CARE_PROVIDER_SITE_OTHER): Payer: BC Managed Care – PPO | Admitting: Internal Medicine

## 2020-01-01 ENCOUNTER — Encounter: Payer: Self-pay | Admitting: Internal Medicine

## 2020-01-01 ENCOUNTER — Other Ambulatory Visit: Payer: Self-pay

## 2020-01-01 DIAGNOSIS — F411 Generalized anxiety disorder: Secondary | ICD-10-CM | POA: Diagnosis not present

## 2020-01-01 DIAGNOSIS — F439 Reaction to severe stress, unspecified: Secondary | ICD-10-CM | POA: Diagnosis not present

## 2020-01-01 DIAGNOSIS — E291 Testicular hypofunction: Secondary | ICD-10-CM

## 2020-01-01 DIAGNOSIS — I10 Essential (primary) hypertension: Secondary | ICD-10-CM

## 2020-01-01 DIAGNOSIS — E559 Vitamin D deficiency, unspecified: Secondary | ICD-10-CM

## 2020-01-01 DIAGNOSIS — R5382 Chronic fatigue, unspecified: Secondary | ICD-10-CM

## 2020-01-01 DIAGNOSIS — F341 Dysthymic disorder: Secondary | ICD-10-CM

## 2020-01-01 DIAGNOSIS — G4709 Other insomnia: Secondary | ICD-10-CM

## 2020-01-01 MED ORDER — BUPROPION HCL ER (XL) 150 MG PO TB24
150.0000 mg | ORAL_TABLET | Freq: Every day | ORAL | 5 refills | Status: DC
Start: 2020-01-01 — End: 2020-03-21

## 2020-01-01 NOTE — Assessment & Plan Note (Signed)
Vit D 

## 2020-01-01 NOTE — Assessment & Plan Note (Signed)
Irbesartan, Amlodipine

## 2020-01-01 NOTE — Assessment & Plan Note (Deleted)
-  

## 2020-01-01 NOTE — Assessment & Plan Note (Addendum)
Situational  Potential benefits of a long term benzodiazepines  use as well as potential risks  and complications were explained to the patient and were aknowledged. Wellbutrin XL to try

## 2020-01-01 NOTE — Assessment & Plan Note (Signed)
Worse Mother is sick - memory loss

## 2020-01-01 NOTE — Assessment & Plan Note (Addendum)
Xanax prn  Potential benefits of a long term benzodiazepines  use as well as potential risks  and complications were explained to the patient and were aknowledged. Wellbutrin XL

## 2020-01-01 NOTE — Assessment & Plan Note (Signed)
CFS - discussed 

## 2020-01-01 NOTE — Assessment & Plan Note (Signed)
Discussed.

## 2020-01-01 NOTE — Progress Notes (Signed)
Subjective:  Patient ID: David Hinton, male    DOB: September 29, 1960  Age: 59 y.o. MRN: [id number]  CC: No chief complaint on file.   HPI [name] presents for HTN C/o CFS - worse x long time C/o stress C/o repeat tick bites - worried about tick born diseases  Pt went to see an alternative practitioner at [facility] - Dr Zachery Conch - " HPI: David Hinton is here today as an integrative medicine consult for chronic fatigue. He last felt well was 09/15/2008. He has had increasing severe stress. He is a veternarian, equine. His job became less rewarding. He was on call. He separated from his girlfriend in 16-Sep-2006. He had a bit of a mid-life crisis. His father died unexpectedly 09-16-10. The handling of the estate caused friction between him and his sisters. His sister was very angry with him. In 09/16/2010 he met his new partner, whom he now feels is a narcissist. He feels he is in excellent physical shape. He feels his diet is excellent compared to most people. He had an episode of severe reflux in with bleeding in 09-16-1994.  In Sep 15, 1993, he moved back to [place]. His mother remarried and he feels she is a Psychologist, clinical and her husband began to embrace extreme religious beliefs, speak in tongues. In 16-Sep-2007, mother developed Crohn's disease. He was very worried about his mother. His mother's husband died in 09/15/2012. Her health is "awful". In Sep 15, 2017 she ended up in a rehab center after a hospitalization. He still is very involved in caring for her. Her farm was sold off to a [place]. He is his mother's POA. He is fearful for his mother's health and fearful his sisters will attack him.  He feels he is very self aware and optimistic. He is not currently seeing a therapist. He does self therapy. He feels his elevated lead level is due to exposure from remodeling an old house.   Sleep: 6-7 hours per night, interrupted every 2 hours.    Assessment:  1. Chronic fatigue 2. Other insomnia 3. Stress  Plan:  I suggest he find a therapist when  possible. Psychology Today website. I suggest he follow up with his PCP regarding his blood pressure. He is clearly under lots of long term stress.  "   Outpatient Medications Prior to Visit  Medication Sig Dispense Refill  . ALPRAZolam (XANAX) 0.5 MG tablet TAKE 1 TABLET BY MOUTH TWICE DAILY AS NEEDED FOR ANXIETY. 60 tablet 1  . amLODipine (NORVASC) 5 MG tablet Take 1 tablet (5 mg total) by mouth daily. 90 tablet 3  . Cholecalciferol (VITAMIN D3) 1.25 MG (50000 UT) CAPS Take 1 capsule by mouth once a week. 6 capsule 0  . Cholecalciferol (VITAMIN D3) 50 MCG (2000 UT) capsule Take 1 capsule (2,000 Units total) by mouth daily. 100 capsule 3  . irbesartan (AVAPRO) 300 MG tablet Take 1 tablet (300 mg total) by mouth daily. Annual appt due June must see provider for future refills 90 tablet 3  . omeprazole (PRILOSEC) 20 MG capsule Take 2 capsules (40 mg total) by mouth daily. 60 capsule 11  . tadalafil (CIALIS) 5 MG tablet Take 1 tablet (5 mg total) by mouth daily. 90 tablet 3  . escitalopram (LEXAPRO) 5 MG tablet Take 1 tablet (5 mg total) by mouth daily. (Patient not taking: Reported on 01/01/2020) 90 tablet 1   No facility-administered medications prior to visit.    ROS: Review of Systems  Constitutional: Positive for fatigue. Negative  for appetite change and unexpected weight change.  HENT: Negative for congestion, nosebleeds, sneezing, sore throat and trouble swallowing.   Eyes: Negative for itching and visual disturbance.  Respiratory: Negative for cough.   Cardiovascular: Negative for chest pain, palpitations and leg swelling.  Gastrointestinal: Negative for abdominal distention, blood in stool, diarrhea and nausea.  Genitourinary: Negative for frequency and hematuria.  Musculoskeletal: Negative for back pain, gait problem, joint swelling and neck pain.  Skin: Negative for rash.  Neurological: Positive for weakness. Negative for dizziness, tremors and speech difficulty.    Psychiatric/Behavioral: Positive for dysphoric mood. Negative for agitation, self-injury, sleep disturbance and suicidal ideas. The patient is nervous/anxious.     Objective:  BP 136/88 (BP Location: Right Arm, Patient Position: Sitting, Cuff Size: Normal)   Pulse (!) 57   Temp 98.4 F (36.9 C) (Oral)   Ht '5\' 11"'$  (1.803 m)   Wt 170 lb (77.1 kg)   SpO2 97%   BMI 23.71 kg/m   BP Readings from Last 3 Encounters:  01/01/20 136/88  12/15/19 122/84  07/04/19 (!) 156/98    Wt Readings from Last 3 Encounters:  01/01/20 170 lb (77.1 kg)  12/15/19 167 lb (75.8 kg)  07/04/19 172 lb (78 kg)    Physical Exam Constitutional:      General: He is not in acute distress.    Appearance: He is well-developed.     Comments: NAD  Eyes:     Conjunctiva/sclera: Conjunctivae normal.     Pupils: Pupils are equal, round, and reactive to light.  Neck:     Thyroid: No thyromegaly.     Vascular: No JVD.  Cardiovascular:     Rate and Rhythm: Normal rate and regular rhythm.     Heart sounds: Normal heart sounds. No murmur heard.  No friction rub. No gallop.   Pulmonary:     Effort: Pulmonary effort is normal. No respiratory distress.     Breath sounds: Normal breath sounds. No wheezing or rales.  Chest:     Chest wall: No tenderness.  Abdominal:     General: Bowel sounds are normal. There is no distension.     Palpations: Abdomen is soft. There is no mass.     Tenderness: There is no abdominal tenderness. There is no guarding or rebound.  Musculoskeletal:        General: No tenderness. Normal range of motion.     Cervical back: Normal range of motion.  Lymphadenopathy:     Cervical: No cervical adenopathy.  Skin:    General: Skin is warm and dry.     Findings: No rash.  Neurological:     Mental Status: He is alert and oriented to person, place, and time.     Cranial Nerves: No cranial nerve deficit.     Motor: No abnormal muscle tone.     Coordination: Coordination normal.     Gait:  Gait normal.     Deep Tendon Reflexes: Reflexes are normal and symmetric.  Psychiatric:        Behavior: Behavior normal.        Thought Content: Thought content normal.        Judgment: Judgment normal.     Lab Results  Component Value Date   WBC 5.3 12/18/2019   HGB 13.7 12/18/2019   HCT 39.4 12/18/2019   PLT 212.0 12/18/2019   GLUCOSE 103 (H) 12/18/2019   CHOL 217 (H) 07/04/2019   TRIG 154.0 (H) 07/04/2019   HDL 46.60 07/04/2019  LDLDIRECT 88.0 11/23/2016   LDLCALC 140 (H) 07/04/2019   ALT 12 12/18/2019   AST 14 12/18/2019   NA 137 12/18/2019   K 4.4 12/18/2019   CL 100 12/18/2019   CREATININE 1.24 12/18/2019   BUN 25 (H) 12/18/2019   CO2 28 12/18/2019   TSH 1.38 12/18/2019   PSA 0.53 07/04/2019   HGBA1C 5.8 12/18/2019    DG Chest 2 View  Result Date: 11/27/2016 CLINICAL DATA:  Cough. EXAM: CHEST  2 VIEW COMPARISON:  Radiographs of August 23, 2014. FINDINGS: The heart size and mediastinal contours are within normal limits. Both lungs are clear. No pneumothorax or pleural effusion is noted. The visualized skeletal structures are unremarkable. IMPRESSION: No active cardiopulmonary disease. Electronically Signed   By: Marijo Conception, M.D.   On: 11/27/2016 13:11    Assessment & Plan:      Walker Kehr, MD

## 2020-01-05 LAB — ROCKY MTN SPOTTED FVR ABS PNL(IGG+IGM)
RMSF IgG: NOT DETECTED
RMSF IgM: NOT DETECTED

## 2020-01-05 LAB — EHRLICHIA ANTIBODY PANEL
E. CHAFFEENSIS AB IGG: <1:64 {titer}
E. CHAFFEENSIS AB IGM: <1:20 {titer}

## 2020-01-05 LAB — B. BURGDORFI ANTIBODIES: B burgdorferi Ab IgG+IgM: <0.9 index

## 2020-01-26 ENCOUNTER — Encounter: Payer: Self-pay | Admitting: Internal Medicine

## 2020-01-26 ENCOUNTER — Ambulatory Visit: Payer: BC Managed Care – PPO | Admitting: Internal Medicine

## 2020-01-26 VITALS — BP 116/88 | HR 64 | Ht 69.0 in | Wt 166.0 lb

## 2020-01-26 DIAGNOSIS — Z1211 Encounter for screening for malignant neoplasm of colon: Secondary | ICD-10-CM

## 2020-01-26 DIAGNOSIS — K219 Gastro-esophageal reflux disease without esophagitis: Secondary | ICD-10-CM | POA: Diagnosis not present

## 2020-01-26 DIAGNOSIS — K222 Esophageal obstruction: Secondary | ICD-10-CM | POA: Diagnosis not present

## 2020-01-26 DIAGNOSIS — R634 Abnormal weight loss: Secondary | ICD-10-CM

## 2020-01-26 MED ORDER — NA SULFATE-K SULFATE-MG SULF 17.5-3.13-1.6 GM/177ML PO SOLN: 1.0000 | Freq: Once | ORAL | 0 refills | Status: AC

## 2020-01-26 NOTE — Patient Instructions (Signed)
If you are age 59 or older, your body mass index should be between 23-30. Your Body mass index is 24.51 kg/m. If this is out of the aforementioned range listed, please consider follow up with your Primary Care Provider.  If you are age 66 or younger, your body mass index should be between 19-25. Your Body mass index is 24.51 kg/m. If this is out of the aformentioned range listed, please consider follow up with your Primary Care Provider.   You have been scheduled for an endoscopy and colonoscopy. Please follow the written instructions given to you at your visit today. Please pick up your prep supplies at the pharmacy within the next 1-3 days. If you use inhalers (even only as needed), please bring them with you on the day of your procedure.  Follow up pending you colonoscopy and endoscopy or as needed.

## 2020-01-26 NOTE — Progress Notes (Signed)
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  Patient has a history of chronic reflux disease for which she is maintained on omeprazole 40 mg daily.  Medication has significant symptoms.  Patient did undergo colonoscopy and upper endoscopy October 09, 1809.  Examinations will be performed for unexplained diarrhea and a family history of IBD.  Colon was normal including random colon biopsies.  The ileum revealed mild nonspecific ileitis which was confirmed with biopsies.  Follow-up in 10 years recommended.  Upper endoscopy to evaluate abdominal pain, GERD, diarrhea revealed large caliber distal esophageal stricture and hiatal hernia.  Duodenal biopsies were normal.  Patient has not been seen since.  Overall, he is doing well.  No significant dysphagia.  However, in the past 6 months he has lost 15 pounds without explanation.  For this he was evaluated by Dr. Alain Marion, including extensive blood work.  No cause found.  Patient has no focal GI complaints such as pain or bleeding.  Review of blood work from December 18, 2019 shows normal CBC with hemoglobin 13.7.  Normal liver test.  Normal protein and albumin.  X-ray file review shows unremarkable abdominal ultrasound.  REVIEW OF SYSTEMS:  All non-GI ROS negative as otherwise stated in the HPI except for anxiety, depression, fatigue, sleeping problems  Past Medical History:  Diagnosis Date  . Anxiety   . Depression   . GERD (gastroesophageal reflux disease)   . HTN (hypertension)     Past Surgical History:  Procedure Laterality Date  . TONSILLECTOMY      Social History [name]  reports that he quit smoking about 35 years ago. His smokeless tobacco use includes snuff. He reports current alcohol use. He reports that he does not use drugs.  family history includes  Cataracts in his father; Crohn's disease in his mother; Heart disease in his father and mother; Mental illness in his mother.  Allergies  Allergen Reactions  . Sildenafil Other (See Comments)     BLURRED VISION  . Trazodone And Nefazodone     Dark feeling       PHYSICAL EXAMINATION: Vital signs: BP 116/88 (BP Location: Left Arm, Patient Position: Sitting, Cuff Size: Normal)   Pulse 64   Ht 5\' 9"  (1.753 m) Comment: height measured without shoes  Wt 166 lb (75.3 kg)   BMI 24.51 kg/m   Constitutional: generally well-appearing, no acute distress Psychiatric: alert and oriented x3, cooperative Eyes: extraocular movements intact, anicteric, conjunctiva pink Mouth: oral pharynx moist, no lesions Neck: supple no lymphadenopathy Cardiovascular: heart regular rate and rhythm, no murmur Lungs: clear to auscultation bilaterally Abdomen: soft, nontender, nondistended, no obvious ascites, no peritoneal signs, normal bowel sounds, no organomegaly Rectal: Deferred to colonoscopy Extremities: no clubbing, cyanosis, or lower extremity edema bilaterally Skin: no lesions on visible extremities Neuro: No focal deficits.  Cranial nerves intact  ASSESSMENT:  1.  Colon cancer screening.  Negative index exam 10 years ago.  Due for follow-up 2.  Family history of IBD and mild ileitis on previous colonoscopy.  No active symptoms to suggest IBD 3.  Chronic GERD.  Requires PPI for control.  Previous endoscopy with out evidence of Barrett's.  He does have a large caliber esophageal stricture and hiatal hernia. 4.  Unexplained weight loss  PLAN:  1.  Colonoscopy for colon cancer screening as well as  to evaluate weight loss.The nature of the procedure, as well as the risks, benefits, and alternatives were carefully and thoroughly reviewed with the patient. Ample time for discussion and questions allowed. The patient understood, was satisfied, and agreed to proceed. 2.  Upper endoscopy the patient with  chronic GERD evaluate unexplained weight loss.The nature of the procedure, as well as the risks, benefits, and alternatives were carefully and thoroughly reviewed with the patient. Ample time for discussion and questions allowed. The patient understood, was satisfied, and agreed to proceed. 3.  Reflux precautions 4.  Continue PPI therapy.  Lowest dose to control symptoms recommended 5.  We discussed medication risk profile. 6.  GI work-up negative, then ongoing weight loss evaluation with Dr. Alain Marion

## 2020-02-22 ENCOUNTER — Encounter: Payer: Self-pay | Admitting: Internal Medicine

## 2020-03-04 ENCOUNTER — Ambulatory Visit (AMBULATORY_SURGERY_CENTER): Payer: BC Managed Care – PPO | Admitting: Internal Medicine

## 2020-03-04 ENCOUNTER — Encounter: Payer: Self-pay | Admitting: Internal Medicine

## 2020-03-04 ENCOUNTER — Other Ambulatory Visit: Payer: Self-pay

## 2020-03-04 VITALS — BP 118/81 | HR 57 | Temp 96.6°F | Resp 11 | Ht 69.0 in | Wt 166.0 lb

## 2020-03-04 DIAGNOSIS — K529 Noninfective gastroenteritis and colitis, unspecified: Secondary | ICD-10-CM

## 2020-03-04 DIAGNOSIS — K219 Gastro-esophageal reflux disease without esophagitis: Secondary | ICD-10-CM

## 2020-03-04 DIAGNOSIS — Z1211 Encounter for screening for malignant neoplasm of colon: Secondary | ICD-10-CM | POA: Diagnosis not present

## 2020-03-04 DIAGNOSIS — R634 Abnormal weight loss: Secondary | ICD-10-CM | POA: Diagnosis not present

## 2020-03-04 DIAGNOSIS — K6389 Other specified diseases of intestine: Secondary | ICD-10-CM

## 2020-03-04 DIAGNOSIS — K222 Esophageal obstruction: Secondary | ICD-10-CM

## 2020-03-04 MED ORDER — SODIUM CHLORIDE 0.9 % IV SOLN: 500.0000 mL | Freq: Once | INTRAVENOUS | Status: DC

## 2020-03-04 NOTE — Progress Notes (Signed)
No problems noted in the recovery room. maw 

## 2020-03-04 NOTE — Patient Instructions (Addendum)
Handouts were given to you on Hemorrhoids, Reflux and Hiatal Hernia. Per Dr. Henrene Pastor continue OMEPRAZOLE to control reflux symptoms. You may resume your current medications today. Await biopsy results. Return to the care of Dr. Alain Marion.  Any additional plans to further evaluate previous weight loss will be left to the discretion of Dr. Alain Marion Please call if any questions or concerns.     YOU HAD AN ENDOSCOPIC PROCEDURE TODAY AT [facility]:   Refer to the procedure report that was given to you for any specific questions about what was found during the examination.  If the procedure report does not answer your questions, please call your gastroenterologist to clarify.  If you requested that your care partner not be given the details of your procedure findings, then the procedure report has been included in a sealed envelope for you to review at your convenience later.  YOU SHOULD EXPECT: Some feelings of bloating in the abdomen. Passage of more gas than usual.  Walking can help get rid of the air that was put into your GI tract during the procedure and reduce the bloating. If you had a lower endoscopy (such as a colonoscopy or flexible sigmoidoscopy) you may notice spotting of blood in your stool or on the toilet paper. If you underwent a bowel prep for your procedure, you may not have a normal bowel movement for a few days.  Please Note:  You might notice some irritation and congestion in your nose or some drainage.  This is from the oxygen used during your procedure.  There is no need for concern and it should clear up in a day or so.  SYMPTOMS TO REPORT IMMEDIATELY:   Following lower endoscopy (colonoscopy or flexible sigmoidoscopy):  Excessive amounts of blood in the stool  Significant tenderness or worsening of abdominal pains  Swelling of the abdomen that is new, acute  Fever of 100F or higher   Following upper endoscopy (EGD)  Vomiting of blood or coffee ground  material  New chest pain or pain under the shoulder blades  Painful or persistently difficult swallowing  New shortness of breath  Fever of 100F or higher  Black, tarry-looking stools  For urgent or emergent issues, a gastroenterologist can be reached at any hour by calling [phone]. Do not use MyChart messaging for urgent concerns.    DIET:  We do recommend a small meal at first, but then you may proceed to your regular diet.  Drink plenty of fluids but you should avoid alcoholic beverages for 24 hours.  ACTIVITY:  You should plan to take it easy for the rest of today and you should NOT DRIVE or use heavy machinery until tomorrow (because of the sedation medicines used during the test).    FOLLOW UP: Our staff will call the number listed on your records 48-72 hours following your procedure to check on you and address any questions or concerns that you may have regarding the information given to you following your procedure. If we do not reach you, we will leave a message.  We will attempt to reach you two times.  During this call, we will ask if you have developed any symptoms of COVID 19. If you develop any symptoms (ie: fever, flu-like symptoms, shortness of breath, cough etc.) before then, please call [phone].  If you test positive for Covid 19 in the 2 weeks post procedure, please call and report this information to [place].    If any biopsies were  taken you will be contacted by phone or by letter within the next 1-3 weeks.  Please call us at [phone] if you have not heard about the biopsies in 3 weeks.    SIGNATURES/CONFIDENTIALITY: You and/or your care partner have signed paperwork which will be entered into your electronic medical record.  These signatures attest to the fact that that the information above on your After Visit Summary has been reviewed and is understood.  Full responsibility of the confidentiality of this discharge information lies with you and/or your  care-partner.

## 2020-03-04 NOTE — Op Note (Signed)
[facility] Patient Name: David Hinton Procedure Date: 03/04/2020 2:24 PM MRN: [id number] Endoscopist: Docia Chuck. Henrene Hinton , MD Age: 59 Referring MD:  Date of Birth: 22-Dec-1960 Gender: Male Account #: [id number] Procedure:                Colonoscopy with biopsies Indications:              Screening for colorectal malignant neoplasm.                            Negative index exam April 2011 (mild ileitis                            noted). Seen recently in the office reporting                            unexplained weight loss. No additional weight loss                            since his visit 5 weeks ago Medicines:                Monitored Anesthesia Care Procedure:                Pre-Anesthesia Assessment:                           - Prior to the procedure, a History and Physical                            was performed, and patient medications and                            allergies were reviewed. The patient's tolerance of                            previous anesthesia was also reviewed. The risks                            and benefits of the procedure and the sedation                            options and risks were discussed with the patient.                            All questions were answered, and informed consent                            was obtained. Prior Anticoagulants: The patient has                            taken no previous anticoagulant or antiplatelet                            agents. ASA Grade Assessment: II - A patient with  mild systemic disease. After reviewing the risks                            and benefits, the patient was deemed in                            satisfactory condition to undergo the procedure.                           After obtaining informed consent, the colonoscope                            was passed under direct vision. Throughout the                            procedure, the patient's blood  pressure, pulse, and                            oxygen saturations were monitored continuously. The                            Colonoscope was introduced through the anus and                            advanced to the the cecum, identified by                            appendiceal orifice and ileocecal valve. The                            terminal ileum, ileocecal valve, appendiceal                            orifice, and rectum were photographed. The quality                            of the bowel preparation was excellent. The                            colonoscopy was performed without difficulty. The                            patient tolerated the procedure well. The bowel                            preparation used was SUPREP via split dose                            instruction.                           RECTAL: Normal prostate examination Scope In: 2:45:00 PM Scope Out: 3:00:20 PM Scope Withdrawal Time: 0 hours 12 minutes 20 seconds  Total Procedure Duration: 0 hours 15 minutes 20 seconds  Findings:  The distal ileum contained a few aphthae. This was                            biopsied with a cold forceps for histology.                           The exam was otherwise without abnormality on                            direct and retroflexion views. Small internal                            hemorrhoids noted. Complications:            No immediate complications. Estimated blood loss:                            None. Estimated Blood Loss:     Estimated blood loss: none. Impression:               - Aphtha in the distal ileum. Biopsied. These                            changes are quite mild and similar to those noted                            10 years ago                           - The examination was otherwise normal on direct                            and retroflexion views. No polyps. Recommendation:           - Repeat colonoscopy in 10 years for screening                             purposes.                           - Patient has a contact number available for                            emergencies. The signs and symptoms of potential                            delayed complications were discussed with the                            patient. Return to normal activities tomorrow.                            Written discharge instructions were provided to the                            patient.                           -  Resume previous diet.                           - Continue present medications.                           - Await pathology results.                           - See EGD report regarding findings and final                            recommendations David Marron N. Henrene Pastor, MD 03/04/2020 3:15:48 PM This report has been signed electronically.

## 2020-03-04 NOTE — Progress Notes (Signed)
Pt's states no medical or surgical changes since previsit or office visit.  Vitals- Sheila 

## 2020-03-04 NOTE — Progress Notes (Signed)
Called to room to assist during endoscopic procedure.  Patient ID and intended procedure confirmed with present staff. Received instructions for my participation in the procedure from the performing physician.  

## 2020-03-04 NOTE — Progress Notes (Signed)
pt tolerated well. VSS. awake and to recovery. Report given to RN. Oral bite block inserted and removed with ease. No trauma.

## 2020-03-04 NOTE — Op Note (Signed)
[place] Patient Name: David Hinton Procedure Date: 03/04/2020 2:24 PM MRN: [id number] Endoscopist: Docia Chuck. Henrene Pastor , MD Age: 59 Referring MD:  Date of Birth: [birth year] Gender: Male Account #: [id number] Procedure:                Upper GI endoscopy Indications:              Esophageal reflux, Weight loss Medicines:                Monitored Anesthesia Care Procedure:                Pre-Anesthesia Assessment:                           - Prior to the procedure, a History and Physical                            was performed, and patient medications and                            allergies were reviewed. The patient's tolerance of                            previous anesthesia was also reviewed. The risks                            and benefits of the procedure and the sedation                            options and risks were discussed with the patient.                            All questions were answered, and informed consent                            was obtained. Prior Anticoagulants: The patient has                            taken no previous anticoagulant or antiplatelet                            agents. ASA Grade Assessment: II - A patient with                            mild systemic disease. After reviewing the risks                            and benefits, the patient was deemed in                            satisfactory condition to undergo the procedure.                           After obtaining informed consent, the endoscope was  passed under direct vision. Throughout the                            procedure, the patient's blood pressure, pulse, and                            oxygen saturations were monitored continuously. The                            Endoscope was introduced through the mouth, and                            advanced to the second part of duodenum. The upper                            GI endoscopy was  accomplished without difficulty.                            The patient tolerated the procedure well. Scope In: Scope Out: Findings:                 The esophagus revealed a large caliber distal                            esophageal stricture. No active esophagitis. No                            Barrett's esophagus.                           The stomach revealed a moderately large hiatal                            hernia measuring 5 cm. No associated Cameron                            erosions.                           The examined duodenum was normal.                           The cardia and gastric fundus were normal on                            retroflexion, save hiatal hernia. Complications:            No immediate complications. Estimated Blood Loss:     Estimated blood loss: none. Impression:               1. Incidental esophageal stricture                           2. Moderately large hiatal hernia. Otherwise normal                            EGD  3. No explanation for weight loss found on either                            endoscopic examination. Recommendation:           1. Reflux precautions                           2. Continue PPI to control reflux symptoms                           3. Return to the care of Dr. Alain Marion. Any                            additional plans for further evaluate previous                            weight loss will be left to the discretion of Dr.                            Alain Marion. Docia Chuck. Henrene Pastor, MD 03/04/2020 3:19:58 PM This report has been signed electronically.

## 2020-03-06 ENCOUNTER — Telehealth: Payer: Self-pay

## 2020-03-06 NOTE — Telephone Encounter (Signed)
  Follow up Call-  Call back number 03/04/2020  Post procedure Call Back phone  # [phone]  Permission to leave phone message Yes  Some recent data might be hidden     Patient questions:  Do you have a fever, pain , or abdominal swelling? No. Pain Score  0 *  Have you tolerated food without any problems? Yes.    Have you been able to return to your normal activities? Yes.    Do you have any questions about your discharge instructions: Diet   No. Medications  No. Follow up visit  No.  Do you have questions or concerns about your Care? No.  Actions: * If pain score is 4 or above: No action needed, pain <4.  1. Have you developed a fever since your procedure? no  2.   Have you had an respiratory symptoms (SOB or cough) since your procedure? no  3.   Have you tested positive for COVID 19 since your procedure no  4.   Have you had any family members/close contacts diagnosed with the COVID 19 since your procedure?  no   If yes to any of these questions please route to Joylene John, RN and Joella Prince, RN

## 2020-03-08 ENCOUNTER — Encounter: Payer: Self-pay | Admitting: Internal Medicine

## 2020-03-18 ENCOUNTER — Other Ambulatory Visit: Payer: Self-pay | Admitting: Internal Medicine

## 2020-03-18 NOTE — Telephone Encounter (Signed)
[place] Controlled Database Checked Last filled: 01/18/2020 (60) LOV w/you: 01/01/2020 Next appt w/you: none

## 2020-03-20 ENCOUNTER — Other Ambulatory Visit: Payer: Self-pay | Admitting: Internal Medicine

## 2020-05-23 DIAGNOSIS — L814 Other melanin hyperpigmentation: Secondary | ICD-10-CM | POA: Diagnosis not present

## 2020-05-23 DIAGNOSIS — Z85828 Personal history of other malignant neoplasm of skin: Secondary | ICD-10-CM | POA: Diagnosis not present

## 2020-05-23 DIAGNOSIS — D225 Melanocytic nevi of trunk: Secondary | ICD-10-CM | POA: Diagnosis not present

## 2020-05-23 DIAGNOSIS — L720 Epidermal cyst: Secondary | ICD-10-CM | POA: Diagnosis not present

## 2020-06-05 DIAGNOSIS — M7541 Impingement syndrome of right shoulder: Secondary | ICD-10-CM | POA: Diagnosis not present

## 2020-06-05 DIAGNOSIS — M7551 Bursitis of right shoulder: Secondary | ICD-10-CM | POA: Diagnosis not present

## 2020-06-05 DIAGNOSIS — S4991XA Unspecified injury of right shoulder and upper arm, initial encounter: Secondary | ICD-10-CM | POA: Diagnosis not present

## 2020-06-05 DIAGNOSIS — M25511 Pain in right shoulder: Secondary | ICD-10-CM | POA: Diagnosis not present

## 2020-06-13 ENCOUNTER — Other Ambulatory Visit: Payer: Self-pay | Admitting: Internal Medicine

## 2020-07-11 ENCOUNTER — Other Ambulatory Visit: Payer: Self-pay | Admitting: Internal Medicine

## 2020-08-21 ENCOUNTER — Other Ambulatory Visit: Payer: Self-pay | Admitting: Internal Medicine

## 2020-10-10 ENCOUNTER — Other Ambulatory Visit: Payer: Self-pay | Admitting: Internal Medicine

## 2020-12-16 ENCOUNTER — Other Ambulatory Visit: Payer: Self-pay

## 2020-12-16 ENCOUNTER — Ambulatory Visit (INDEPENDENT_AMBULATORY_CARE_PROVIDER_SITE_OTHER): Payer: BC Managed Care – PPO | Admitting: Internal Medicine

## 2020-12-16 ENCOUNTER — Encounter: Payer: Self-pay | Admitting: Internal Medicine

## 2020-12-16 VITALS — BP 150/90 | HR 56 | Temp 98.7°F | Ht 69.0 in | Wt 165.8 lb

## 2020-12-16 DIAGNOSIS — R5383 Other fatigue: Secondary | ICD-10-CM

## 2020-12-16 DIAGNOSIS — Z23 Encounter for immunization: Secondary | ICD-10-CM

## 2020-12-16 DIAGNOSIS — W57XXXS Bitten or stung by nonvenomous insect and other nonvenomous arthropods, sequela: Secondary | ICD-10-CM

## 2020-12-16 DIAGNOSIS — Z125 Encounter for screening for malignant neoplasm of prostate: Secondary | ICD-10-CM

## 2020-12-16 DIAGNOSIS — Z0000 Encounter for general adult medical examination without abnormal findings: Secondary | ICD-10-CM | POA: Diagnosis not present

## 2020-12-16 LAB — URINALYSIS
Bilirubin Urine: NEGATIVE
Ketones, ur: NEGATIVE
Leukocytes,Ua: NEGATIVE
Nitrite: NEGATIVE
Specific Gravity, Urine: 1.02 (ref 1.000–1.030)
Total Protein, Urine: NEGATIVE
Urine Glucose: NEGATIVE
Urobilinogen, UA: 0.2 (ref 0.0–1.0)
pH: 6 (ref 5.0–8.0)

## 2020-12-16 LAB — CBC WITH DIFFERENTIAL/PLATELET
Basophils Absolute: 0 10*3/uL (ref 0.0–0.1)
Basophils Relative: 0.7 % (ref 0.0–3.0)
Eosinophils Absolute: 0.1 10*3/uL (ref 0.0–0.7)
Eosinophils Relative: 2 % (ref 0.0–5.0)
HCT: 43.1 % (ref 39.0–52.0)
Hemoglobin: 14.6 g/dL (ref 13.0–17.0)
Lymphocytes Relative: 24.6 % (ref 12.0–46.0)
Lymphs Abs: 1.5 10*3/uL (ref 0.7–4.0)
MCHC: 33.8 g/dL (ref 30.0–36.0)
MCV: 96.3 fl (ref 78.0–100.0)
Monocytes Absolute: 0.7 10*3/uL (ref 0.1–1.0)
Monocytes Relative: 11.1 % (ref 3.0–12.0)
Neutro Abs: 3.8 10*3/uL (ref 1.4–7.7)
Neutrophils Relative %: 61.6 % (ref 43.0–77.0)
Platelets: 225 10*3/uL (ref 150.0–400.0)
RBC: 4.47 Mil/uL (ref 4.22–5.81)
RDW: 13.3 % (ref 11.5–15.5)
WBC: 6.2 10*3/uL (ref 4.0–10.5)

## 2020-12-16 LAB — LIPID PANEL
Cholesterol: 194 mg/dL (ref 0–200)
HDL: 36.6 mg/dL — ABNORMAL LOW (ref >39.00)
NonHDL: 156.96
Total CHOL/HDL Ratio: 5
Triglycerides: 270 mg/dL — ABNORMAL HIGH (ref 0.0–149.0)
VLDL: 54 mg/dL — ABNORMAL HIGH (ref 0.0–40.0)

## 2020-12-16 LAB — COMPREHENSIVE METABOLIC PANEL
ALT: 15 U/L (ref 0–53)
AST: 21 U/L (ref 0–37)
Albumin: 4.6 g/dL (ref 3.5–5.2)
Alkaline Phosphatase: 56 U/L (ref 39–117)
BUN: 21 mg/dL (ref 6–23)
CO2: 27 mEq/L (ref 19–32)
Calcium: 10.2 mg/dL (ref 8.4–10.5)
Chloride: 102 mEq/L (ref 96–112)
Creatinine, Ser: 1.17 mg/dL (ref 0.40–1.50)
GFR: 67.93 mL/min (ref >60.00)
Glucose, Bld: 101 mg/dL — ABNORMAL HIGH (ref 70–99)
Potassium: 4.9 mEq/L (ref 3.5–5.1)
Sodium: 136 mEq/L (ref 135–145)
Total Bilirubin: 0.4 mg/dL (ref 0.2–1.2)
Total Protein: 7 g/dL (ref 6.0–8.3)

## 2020-12-16 LAB — LDL CHOLESTEROL, DIRECT: Direct LDL: 107 mg/dL

## 2020-12-16 LAB — TESTOSTERONE: Testosterone: 246.62 ng/dL — ABNORMAL LOW (ref 300.00–890.00)

## 2020-12-16 LAB — TSH: TSH: 1.53 u[IU]/mL (ref 0.35–4.50)

## 2020-12-16 LAB — PSA: PSA: 0.45 ng/mL (ref 0.10–4.00)

## 2020-12-16 MED ORDER — IRBESARTAN 300 MG PO TABS: ORAL_TABLET | ORAL | 3 refills | Status: DC

## 2020-12-16 MED ORDER — BUPROPION HCL ER (XL) 150 MG PO TB24: 150.0000 mg | ORAL_TABLET | Freq: Every day | ORAL | 3 refills | Status: DC

## 2020-12-16 MED ORDER — TADALAFIL 5 MG PO TABS: 5.0000 mg | ORAL_TABLET | Freq: Every day | ORAL | 3 refills | Status: DC

## 2020-12-16 MED ORDER — AMLODIPINE BESYLATE 5 MG PO TABS: 5.0000 mg | ORAL_TABLET | Freq: Every day | ORAL | 3 refills | Status: DC

## 2020-12-16 NOTE — Addendum Note (Signed)
Addended by: Boris Lown B on: 12/16/2020 09:14 AM   Modules accepted: Orders

## 2020-12-16 NOTE — Assessment & Plan Note (Signed)

## 2020-12-16 NOTE — Progress Notes (Signed)
Subjective:  Patient ID: David Hinton, male    DOB: 07-13-1960  Age: 60 y.o. MRN: [id number]  CC: Annual Exam   HPI BOYKIN BAETZ presents for a well exam  Outpatient Medications Prior to Visit  Medication Sig Dispense Refill   ALPRAZolam (XANAX) 0.5 MG tablet TAKE 1 TABLET BY MOUTH TWICE DAILY AS NEEDED FOR ANXIETY 60 tablet 1   Cholecalciferol (VITAMIN D3) 50 MCG (2000 UT) capsule Take 1 capsule (2,000 Units total) by mouth daily. 100 capsule 3   omeprazole (PRILOSEC) 20 MG capsule Take 2 capsules (40 mg total) by mouth daily. 60 capsule 11   amLODipine (NORVASC) 5 MG tablet Take 1 tablet (5 mg total) by mouth daily. Annual appt is due must see provider for future refills 30 tablet 0   buPROPion (WELLBUTRIN XL) 150 MG 24 hr tablet Take 1 tablet (150 mg total) by mouth daily. Annual appt due in July must see provider for future refills 90 tablet 0   irbesartan (AVAPRO) 300 MG tablet TAKE 1 TABLET(300 MG TOTAL) BY MOUTH DAILY. ANNUAL APPT DUE IN [employer]. SEE PROVIDER FOR FUTURE REFILLS. 90 tablet 3   tadalafil (CIALIS) 5 MG tablet Take 1 tablet (5 mg total) by mouth daily. 90 tablet 3   No facility-administered medications prior to visit.    ROS: Review of Systems  Constitutional:  Negative for appetite change, fatigue and unexpected weight change.  HENT:  Negative for congestion, nosebleeds, sneezing, sore throat and trouble swallowing.   Eyes:  Negative for itching and visual disturbance.  Respiratory:  Negative for cough.   Cardiovascular:  Negative for chest pain, palpitations and leg swelling.  Gastrointestinal:  Negative for abdominal distention, blood in stool, diarrhea and nausea.  Genitourinary:  Negative for frequency and hematuria.  Musculoskeletal:  Negative for back pain, gait problem, joint swelling and neck pain.  Skin:  Negative for rash.  Neurological:  Negative for dizziness, tremors, speech difficulty and weakness.  Psychiatric/Behavioral:  Negative for  agitation, dysphoric mood and sleep disturbance. The patient is not nervous/anxious.    Objective:  BP (!) 150/90 (BP Location: Left Arm)   Pulse (!) 56   Temp 98.7 F (37.1 C) (Oral)   Ht 5\' 9"  (1.753 m)   Wt 165 lb 12.8 oz (75.2 kg)   SpO2 97%   BMI 24.48 kg/m   BP Readings from Last 3 Encounters:  12/16/20 (!) 150/90  03/04/20 118/81  01/26/20 116/88    Wt Readings from Last 3 Encounters:  12/16/20 165 lb 12.8 oz (75.2 kg)  03/04/20 166 lb (75.3 kg)  01/26/20 166 lb (75.3 kg)    Physical Exam Constitutional:      General: He is not in acute distress.    Appearance: He is well-developed.     Comments: NAD  Eyes:     Conjunctiva/sclera: Conjunctivae normal.     Pupils: Pupils are equal, round, and reactive to light.  Neck:     Thyroid: No thyromegaly.     Vascular: No JVD.  Cardiovascular:     Rate and Rhythm: Normal rate and regular rhythm.     Heart sounds: Normal heart sounds. No murmur heard.   No friction rub. No gallop.  Pulmonary:     Effort: Pulmonary effort is normal. No respiratory distress.     Breath sounds: Normal breath sounds. No wheezing or rales.  Chest:     Chest wall: No tenderness.  Abdominal:     General: Bowel sounds are  normal. There is no distension.     Palpations: Abdomen is soft. There is no mass.     Tenderness: There is no abdominal tenderness. There is no guarding or rebound.  Musculoskeletal:        General: No tenderness. Normal range of motion.     Cervical back: Normal range of motion.  Lymphadenopathy:     Cervical: No cervical adenopathy.  Skin:    General: Skin is warm and dry.     Findings: No rash.  Neurological:     Mental Status: He is alert and oriented to person, place, and time.     Cranial Nerves: No cranial nerve deficit.     Motor: No abnormal muscle tone.     Coordination: Coordination normal.     Gait: Gait normal.     Deep Tendon Reflexes: Reflexes are normal and symmetric.  Psychiatric:         Behavior: Behavior normal.        Thought Content: Thought content normal.        Judgment: Judgment normal.   Prostate for GI  Lab Results  Component Value Date   WBC 5.3 12/18/2019   HGB 13.7 12/18/2019   HCT 39.4 12/18/2019   PLT 212.0 12/18/2019   GLUCOSE 103 (H) 12/18/2019   CHOL 217 (H) 07/04/2019   TRIG 154.0 (H) 07/04/2019   HDL 46.60 07/04/2019   LDLDIRECT 88.0 11/23/2016   LDLCALC 140 (H) 07/04/2019   ALT 12 12/18/2019   AST 14 12/18/2019   NA 137 12/18/2019   K 4.4 12/18/2019   CL 100 12/18/2019   CREATININE 1.24 12/18/2019   BUN 25 (H) 12/18/2019   CO2 28 12/18/2019   TSH 1.38 12/18/2019   PSA 0.53 07/04/2019   HGBA1C 5.8 12/18/2019    DG Chest 2 View  Result Date: 11/27/2016 CLINICAL DATA:  Cough. EXAM: CHEST  2 VIEW COMPARISON:  Radiographs of August 23, 2014. FINDINGS: The heart size and mediastinal contours are within normal limits. Both lungs are clear. No pneumothorax or pleural effusion is noted. The visualized skeletal structures are unremarkable. IMPRESSION: No active cardiopulmonary disease. Electronically Signed   By: Marijo Conception, M.D.   On: 11/27/2016 13:11    Assessment & Plan:     Walker Kehr, MD

## 2020-12-16 NOTE — Addendum Note (Signed)
Addended by: Earnstine Regal on: 12/16/2020 09:17 AM   Modules accepted: Orders

## 2020-12-25 LAB — EHRLICHIA ANTIBODY PANEL
E. CHAFFEENSIS AB IGG: <1:64 {titer}
E. CHAFFEENSIS AB IGM: <1:20 {titer}

## 2020-12-25 LAB — ROCKY MTN SPOTTED FVR ABS PNL(IGG+IGM)
RMSF IgG: NOT DETECTED
RMSF IgM: NOT DETECTED

## 2020-12-25 LAB — B. BURGDORFI ANTIBODIES: B burgdorferi Ab IgG+IgM: <0.9 index

## 2021-01-08 ENCOUNTER — Other Ambulatory Visit: Payer: Self-pay | Admitting: Internal Medicine

## 2021-02-19 ENCOUNTER — Other Ambulatory Visit: Payer: Self-pay

## 2021-02-19 ENCOUNTER — Ambulatory Visit (INDEPENDENT_AMBULATORY_CARE_PROVIDER_SITE_OTHER): Payer: BC Managed Care – PPO

## 2021-02-19 DIAGNOSIS — Z23 Encounter for immunization: Secondary | ICD-10-CM

## 2021-02-19 NOTE — Progress Notes (Addendum)
2nd Zoster Vacc given w/o any complications.  Medical screening examination/treatment/procedure(s) were performed by non-physician practitioner and as supervising physician I was immediately available for consultation/collaboration.  I agree with above. Lew Dawes, MD

## 2021-06-18 ENCOUNTER — Ambulatory Visit: Payer: BC Managed Care – PPO | Admitting: Internal Medicine

## 2021-06-18 ENCOUNTER — Other Ambulatory Visit: Payer: Self-pay

## 2021-06-18 ENCOUNTER — Encounter: Payer: Self-pay | Admitting: Internal Medicine

## 2021-06-18 DIAGNOSIS — E291 Testicular hypofunction: Secondary | ICD-10-CM | POA: Diagnosis not present

## 2021-06-18 DIAGNOSIS — N320 Bladder-neck obstruction: Secondary | ICD-10-CM

## 2021-06-18 DIAGNOSIS — D485 Neoplasm of uncertain behavior of skin: Secondary | ICD-10-CM | POA: Diagnosis not present

## 2021-06-18 DIAGNOSIS — F341 Dysthymic disorder: Secondary | ICD-10-CM

## 2021-06-18 DIAGNOSIS — I10 Essential (primary) hypertension: Secondary | ICD-10-CM | POA: Diagnosis not present

## 2021-06-18 DIAGNOSIS — D2261 Melanocytic nevi of right upper limb, including shoulder: Secondary | ICD-10-CM | POA: Diagnosis not present

## 2021-06-18 DIAGNOSIS — F439 Reaction to severe stress, unspecified: Secondary | ICD-10-CM

## 2021-06-18 DIAGNOSIS — L720 Epidermal cyst: Secondary | ICD-10-CM | POA: Diagnosis not present

## 2021-06-18 DIAGNOSIS — F411 Generalized anxiety disorder: Secondary | ICD-10-CM

## 2021-06-18 DIAGNOSIS — G4709 Other insomnia: Secondary | ICD-10-CM

## 2021-06-18 DIAGNOSIS — Z85828 Personal history of other malignant neoplasm of skin: Secondary | ICD-10-CM | POA: Diagnosis not present

## 2021-06-18 DIAGNOSIS — C44719 Basal cell carcinoma of skin of left lower limb, including hip: Secondary | ICD-10-CM | POA: Diagnosis not present

## 2021-06-18 DIAGNOSIS — L821 Other seborrheic keratosis: Secondary | ICD-10-CM | POA: Diagnosis not present

## 2021-06-18 LAB — CBC WITH DIFFERENTIAL/PLATELET
Basophils Absolute: 0 10*3/uL (ref 0.0–0.1)
Basophils Relative: 0.5 % (ref 0.0–3.0)
Eosinophils Absolute: 0.1 10*3/uL (ref 0.0–0.7)
Eosinophils Relative: 1.7 % (ref 0.0–5.0)
HCT: 43.1 % (ref 39.0–52.0)
Hemoglobin: 14.7 g/dL (ref 13.0–17.0)
Lymphocytes Relative: 17.4 % (ref 12.0–46.0)
Lymphs Abs: 1.3 10*3/uL (ref 0.7–4.0)
MCHC: 34 g/dL (ref 30.0–36.0)
MCV: 96.8 fl (ref 78.0–100.0)
Monocytes Absolute: 0.6 10*3/uL (ref 0.1–1.0)
Monocytes Relative: 8.4 % (ref 3.0–12.0)
Neutro Abs: 5.6 10*3/uL (ref 1.4–7.7)
Neutrophils Relative %: 72 % (ref 43.0–77.0)
Platelets: 226 10*3/uL (ref 150.0–400.0)
RBC: 4.46 Mil/uL (ref 4.22–5.81)
RDW: 12.4 % (ref 11.5–15.5)
WBC: 7.7 10*3/uL (ref 4.0–10.5)

## 2021-06-18 LAB — TESTOSTERONE: Testosterone: 241.5 ng/dL — ABNORMAL LOW (ref 300.00–890.00)

## 2021-06-18 LAB — URINALYSIS
Bilirubin Urine: NEGATIVE
Ketones, ur: NEGATIVE
Leukocytes,Ua: NEGATIVE
Nitrite: NEGATIVE
Specific Gravity, Urine: 1.015 (ref 1.000–1.030)
Total Protein, Urine: NEGATIVE
Urine Glucose: NEGATIVE
Urobilinogen, UA: 0.2 (ref 0.0–1.0)
pH: 6 (ref 5.0–8.0)

## 2021-06-18 LAB — PSA: PSA: 1.38 ng/mL (ref 0.10–4.00)

## 2021-06-18 MED ORDER — TAMSULOSIN HCL 0.4 MG PO CAPS: 0.4000 mg | ORAL_CAPSULE | Freq: Every day | ORAL | 3 refills | Status: DC

## 2021-06-18 NOTE — Assessment & Plan Note (Signed)
Options discussed Start Flomax Urol ref offered

## 2021-06-18 NOTE — Assessment & Plan Note (Signed)
Check testosterone. 

## 2021-06-18 NOTE — Assessment & Plan Note (Signed)
Pt finished [employer]; planning to move to [employer].

## 2021-06-18 NOTE — Assessment & Plan Note (Signed)
Situational. °

## 2021-06-18 NOTE — Assessment & Plan Note (Signed)
1 Pt finished [employer]; planning to move to [employer].

## 2021-06-18 NOTE — Progress Notes (Signed)
Subjective:  Patient ID: David Hinton, male    DOB: 1960/12/02  Age: 60 y.o. MRN: [id number]  CC: Follow-up (6 month follow up)   HPI David Hinton presents for - finished [employer]; planning to move to [employer]. C/o anxiety, depression, GERD, hypogonadism f/u  Outpatient Medications Prior to Visit  Medication Sig Dispense Refill   ALPRAZolam (XANAX) 0.5 MG tablet TAKE 1 TABLET BY MOUTH TWICE DAILY AS NEEDED FOR ANXIETY 60 tablet 3   amLODipine (NORVASC) 5 MG tablet Take 1 tablet (5 mg total) by mouth daily. 90 tablet 3   buPROPion (WELLBUTRIN XL) 150 MG 24 hr tablet Take 1 tablet (150 mg total) by mouth daily. 90 tablet 3   Cholecalciferol (VITAMIN D3) 50 MCG (2000 UT) capsule Take 1 capsule (2,000 Units total) by mouth daily. 100 capsule 3   irbesartan (AVAPRO) 300 MG tablet TAKE 1 TABLET(300 MG TOTAL) BY MOUTH DAILY. 90 tablet 3   omeprazole (PRILOSEC) 20 MG capsule Take 2 capsules (40 mg total) by mouth daily. 60 capsule 11   tadalafil (CIALIS) 5 MG tablet Take 1 tablet (5 mg total) by mouth daily. 90 tablet 3   No facility-administered medications prior to visit.    ROS: Review of Systems  Constitutional:  Negative for appetite change, fatigue and unexpected weight change.  HENT:  Negative for congestion, nosebleeds, sneezing, sore throat and trouble swallowing.   Eyes:  Negative for itching and visual disturbance.  Respiratory:  Negative for cough.   Cardiovascular:  Negative for chest pain, palpitations and leg swelling.  Gastrointestinal:  Negative for abdominal distention, blood in stool, diarrhea and nausea.  Genitourinary:  Negative for frequency and hematuria.  Musculoskeletal:  Negative for back pain, gait problem, joint swelling and neck pain.  Skin:  Negative for rash.  Neurological:  Negative for dizziness, tremors, speech difficulty and weakness.  Psychiatric/Behavioral:  Negative for agitation, dysphoric mood and sleep disturbance.  The patient is nervous/anxious.    Objective:  BP 140/90 (BP Location: Left Arm, Patient Position: Sitting, Cuff Size: Normal)    Pulse 71    Temp 98 F (36.7 C) (Oral)    Ht 5\' 9"  (1.753 m)    Wt 174 lb (78.9 kg)    SpO2 98%    BMI 25.70 kg/m   BP Readings from Last 3 Encounters:  06/18/21 140/90  12/16/20 (!) 150/90  03/04/20 118/81    Wt Readings from Last 3 Encounters:  06/18/21 174 lb (78.9 kg)  12/16/20 165 lb 12.8 oz (75.2 kg)  03/04/20 166 lb (75.3 kg)    Physical Exam Constitutional:      General: He is not in acute distress.    Appearance: He is well-developed.     Comments: NAD  Eyes:     Conjunctiva/sclera: Conjunctivae normal.     Pupils: Pupils are equal, round, and reactive to light.  Neck:     Thyroid: No thyromegaly.     Vascular: No JVD.  Cardiovascular:     Rate and Rhythm: Normal rate and regular rhythm.     Heart sounds: Normal heart sounds. No murmur heard.   No friction rub. No gallop.  Pulmonary:     Effort: Pulmonary effort is normal. No respiratory distress.     Breath sounds: Normal breath sounds. No wheezing or rales.  Chest:     Chest wall: No tenderness.  Abdominal:     General: Bowel sounds are normal. There is no  distension.     Palpations: Abdomen is soft. There is no mass.     Tenderness: There is no abdominal tenderness. There is no guarding or rebound.  Musculoskeletal:        General: No tenderness. Normal range of motion.     Cervical back: Normal range of motion.  Lymphadenopathy:     Cervical: No cervical adenopathy.  Skin:    General: Skin is warm and dry.     Findings: No rash.  Neurological:     Mental Status: He is alert and oriented to person, place, and time.     Cranial Nerves: No cranial nerve deficit.     Motor: No abnormal muscle tone.     Coordination: Coordination normal.     Gait: Gait normal.     Deep Tendon Reflexes: Reflexes are normal and symmetric.  Psychiatric:        Behavior: Behavior normal.         Thought Content: Thought content normal.        Judgment: Judgment normal.    Lab Results  Component Value Date   WBC 6.2 12/16/2020   HGB 14.6 12/16/2020   HCT 43.1 12/16/2020   PLT 225.0 12/16/2020   GLUCOSE 101 (H) 12/16/2020   CHOL 194 12/16/2020   TRIG 270.0 (H) 12/16/2020   HDL 36.60 (L) 12/16/2020   LDLDIRECT 107.0 12/16/2020   LDLCALC 140 (H) 07/04/2019   ALT 15 12/16/2020   AST 21 12/16/2020   NA 136 12/16/2020   K 4.9 12/16/2020   CL 102 12/16/2020   CREATININE 1.17 12/16/2020   BUN 21 12/16/2020   CO2 27 12/16/2020   TSH 1.53 12/16/2020   PSA 0.45 12/16/2020   HGBA1C 5.8 12/18/2019    DG Chest 2 View  Result Date: 11/27/2016 CLINICAL DATA:  Cough. EXAM: CHEST  2 VIEW COMPARISON:  Radiographs of August 23, 2014. FINDINGS: The heart size and mediastinal contours are within normal limits. Both lungs are clear. No pneumothorax or pleural effusion is noted. The visualized skeletal structures are unremarkable. IMPRESSION: No active cardiopulmonary disease. Electronically Signed   By: Marijo Conception, M.D.   On: 11/27/2016 13:11    Assessment & Plan:   Problem List Items Addressed This Visit     Bladder neck obstruction    Options discussed Start Flomax Urol ref offered      Relevant Orders   PSA   DEPRESSION/ANXIETY    1 Pt finished [employer]; planning to move to [employer].      Generalized anxiety disorder    Cont on Wellbutrin XL      HTN (hypertension)    Cont on Amlodipine      Hypogonadism in male    Check testosterone      Relevant Orders   Testosterone   Urinalysis   Heavy Metals Profile, Urine   Insomnia disorder    Situational      Stress    Pt finished [employer]; planning to move to [employer].      Relevant Orders   Testosterone   Urinalysis   Heavy Metals Profile, Urine   CBC with Differential/Platelet      Meds ordered this encounter  Medications   tamsulosin (FLOMAX) 0.4 MG  CAPS capsule    Sig: Take 1 capsule (0.4 mg total) by mouth daily.    Dispense:  90 capsule    Refill:  3      Follow-up: Return  in about 3 months (around 09/16/2021) for a follow-up visit.  Walker Kehr, MD

## 2021-06-18 NOTE — Assessment & Plan Note (Signed)
Cont on Wellbutrin XL 

## 2021-06-18 NOTE — Assessment & Plan Note (Signed)
Cont on Amlodipine 

## 2021-07-02 DIAGNOSIS — C44719 Basal cell carcinoma of skin of left lower limb, including hip: Secondary | ICD-10-CM | POA: Diagnosis not present

## 2021-08-06 ENCOUNTER — Telehealth: Payer: Self-pay | Admitting: Internal Medicine

## 2021-08-06 NOTE — Telephone Encounter (Signed)
1.Medication Requested:  ALPRAZolam (XANAX) 0.5 MG tablet amLODipine (NORVASC) 5 MG tablet irbesartan (AVAPRO) 300 MG tablet buPROPion (WELLBUTRIN XL) 150 MG 24 hr tablet 2. Pharmacy (Name, Street, [place]): [id number] - [place], [place] - [address]  3. On Med List: yes  4. Last Visit with PCP: 06-18-2021  5. Next visit date with PCP: 09-17-2021   Agent: Please be advised that RX refills may take up to 3 business days. We ask that you follow-up with your pharmacy.

## 2021-08-07 ENCOUNTER — Other Ambulatory Visit: Payer: Self-pay

## 2021-08-07 MED ORDER — AMLODIPINE BESYLATE 5 MG PO TABS: 5.0000 mg | ORAL_TABLET | Freq: Every day | ORAL | 3 refills | Status: DC

## 2021-08-07 MED ORDER — IRBESARTAN 300 MG PO TABS: ORAL_TABLET | ORAL | 3 refills | Status: DC

## 2021-08-07 MED ORDER — BUPROPION HCL ER (XL) 150 MG PO TB24: 150.0000 mg | ORAL_TABLET | Freq: Every day | ORAL | 3 refills | Status: DC

## 2021-08-07 MED ORDER — ALPRAZOLAM 0.5 MG PO TABS: 0.5000 mg | ORAL_TABLET | Freq: Two times a day (BID) | ORAL | 3 refills | Status: DC | PRN

## 2021-08-07 NOTE — Telephone Encounter (Signed)
Okay.  Thanks.

## 2021-09-17 ENCOUNTER — Ambulatory Visit: Payer: BC Managed Care – PPO | Admitting: Internal Medicine

## 2021-10-01 ENCOUNTER — Ambulatory Visit: Payer: BC Managed Care – PPO | Admitting: Internal Medicine

## 2022-01-20 DIAGNOSIS — W57XXXA Bitten or stung by nonvenomous insect and other nonvenomous arthropods, initial encounter: Secondary | ICD-10-CM | POA: Diagnosis not present

## 2022-01-20 DIAGNOSIS — L720 Epidermal cyst: Secondary | ICD-10-CM | POA: Diagnosis not present

## 2022-01-21 DIAGNOSIS — L0889 Other specified local infections of the skin and subcutaneous tissue: Secondary | ICD-10-CM | POA: Diagnosis not present

## 2022-01-21 DIAGNOSIS — L720 Epidermal cyst: Secondary | ICD-10-CM | POA: Diagnosis not present

## 2022-02-09 ENCOUNTER — Other Ambulatory Visit: Payer: Self-pay | Admitting: Internal Medicine

## 2022-02-10 NOTE — Telephone Encounter (Signed)
Check [place] registry last filled 12/08/2021.Marland KitchenJohny Hinton

## 2022-02-18 ENCOUNTER — Telehealth: Payer: Self-pay

## 2022-02-18 NOTE — Telephone Encounter (Signed)
NOTE NOT NEEDED ?

## 2022-05-30 ENCOUNTER — Other Ambulatory Visit: Payer: Self-pay | Admitting: Internal Medicine

## 2022-06-17 DIAGNOSIS — L814 Other melanin hyperpigmentation: Secondary | ICD-10-CM | POA: Diagnosis not present

## 2022-06-17 DIAGNOSIS — L720 Epidermal cyst: Secondary | ICD-10-CM | POA: Diagnosis not present

## 2022-06-17 DIAGNOSIS — D1801 Hemangioma of skin and subcutaneous tissue: Secondary | ICD-10-CM | POA: Diagnosis not present

## 2022-06-17 DIAGNOSIS — C44619 Basal cell carcinoma of skin of left upper limb, including shoulder: Secondary | ICD-10-CM | POA: Diagnosis not present

## 2022-06-17 DIAGNOSIS — Z85828 Personal history of other malignant neoplasm of skin: Secondary | ICD-10-CM | POA: Diagnosis not present

## 2022-06-17 DIAGNOSIS — D225 Melanocytic nevi of trunk: Secondary | ICD-10-CM | POA: Diagnosis not present

## 2022-06-30 ENCOUNTER — Encounter: Payer: Self-pay | Admitting: Internal Medicine

## 2022-08-17 ENCOUNTER — Other Ambulatory Visit: Payer: Self-pay | Admitting: Internal Medicine

## 2022-09-15 ENCOUNTER — Other Ambulatory Visit: Payer: Self-pay | Admitting: Internal Medicine

## 2022-09-22 ENCOUNTER — Other Ambulatory Visit: Payer: Self-pay | Admitting: Internal Medicine

## 2022-09-23 ENCOUNTER — Telehealth: Payer: Self-pay | Admitting: Internal Medicine

## 2022-09-23 MED ORDER — AMLODIPINE BESYLATE 5 MG PO TABS: 5.0000 mg | ORAL_TABLET | Freq: Every day | ORAL | 0 refills | Status: DC

## 2022-09-23 MED ORDER — BUPROPION HCL ER (XL) 150 MG PO TB24: 150.0000 mg | ORAL_TABLET | Freq: Every day | ORAL | 0 refills | Status: DC

## 2022-09-23 MED ORDER — IRBESARTAN 300 MG PO TABS: ORAL_TABLET | ORAL | 0 refills | Status: DC

## 2022-09-23 NOTE — Telephone Encounter (Signed)
Sent 30 day supply =until appt../lmb 

## 2022-09-23 NOTE — Telephone Encounter (Signed)
Prescription Request  09/23/2022  LOV: 2022 (Informed PT that we might be able to do a partial but could not guarantee it since it had been so long)  What is the name of the medication or equipment?  amLODipine (NORVASC) 5 MG tablet   buPROPion (WELLBUTRIN XL) 150 MG 24 hr tablet   irbesartan (AVAPRO) 300 MG tablet   Have you contacted your pharmacy to request a refill? Yes   Which pharmacy would you like this sent to?   [id number] [address] [place] [address] Phone: [phone] Fax: [phone]    Patient notified that their request is being sent to the clinical staff for review and that they should receive a response within 2 business days.   Please advise at [facility] [phone]

## 2022-10-14 ENCOUNTER — Encounter: Payer: Self-pay | Admitting: Internal Medicine

## 2022-10-14 ENCOUNTER — Ambulatory Visit (INDEPENDENT_AMBULATORY_CARE_PROVIDER_SITE_OTHER): Payer: BC Managed Care – PPO | Admitting: Internal Medicine

## 2022-10-14 VITALS — BP 112/70 | HR 73 | Temp 99.7°F | Ht 69.0 in | Wt 175.0 lb

## 2022-10-14 DIAGNOSIS — G4709 Other insomnia: Secondary | ICD-10-CM

## 2022-10-14 DIAGNOSIS — Z125 Encounter for screening for malignant neoplasm of prostate: Secondary | ICD-10-CM

## 2022-10-14 DIAGNOSIS — R944 Abnormal results of kidney function studies: Secondary | ICD-10-CM

## 2022-10-14 DIAGNOSIS — E291 Testicular hypofunction: Secondary | ICD-10-CM

## 2022-10-14 DIAGNOSIS — N401 Enlarged prostate with lower urinary tract symptoms: Secondary | ICD-10-CM | POA: Diagnosis not present

## 2022-10-14 DIAGNOSIS — I10 Essential (primary) hypertension: Secondary | ICD-10-CM | POA: Diagnosis not present

## 2022-10-14 DIAGNOSIS — Z0000 Encounter for general adult medical examination without abnormal findings: Secondary | ICD-10-CM | POA: Diagnosis not present

## 2022-10-14 DIAGNOSIS — E559 Vitamin D deficiency, unspecified: Secondary | ICD-10-CM

## 2022-10-14 DIAGNOSIS — R5382 Chronic fatigue, unspecified: Secondary | ICD-10-CM | POA: Diagnosis not present

## 2022-10-14 DIAGNOSIS — N2889 Other specified disorders of kidney and ureter: Secondary | ICD-10-CM

## 2022-10-14 LAB — COMPREHENSIVE METABOLIC PANEL
ALT: 14 U/L (ref 0–53)
AST: 20 U/L (ref 0–37)
Albumin: 4.3 g/dL (ref 3.5–5.2)
Alkaline Phosphatase: 67 U/L (ref 39–117)
BUN: 19 mg/dL (ref 6–23)
CO2: 27 mEq/L (ref 19–32)
Calcium: 9.7 mg/dL (ref 8.4–10.5)
Chloride: 105 mEq/L (ref 96–112)
Creatinine, Ser: 1.67 mg/dL — ABNORMAL HIGH (ref 0.40–1.50)
GFR: 43.76 mL/min — ABNORMAL LOW (ref >60.00)
Glucose, Bld: 90 mg/dL (ref 70–99)
Potassium: 4.7 mEq/L (ref 3.5–5.1)
Sodium: 140 mEq/L (ref 135–145)
Total Bilirubin: 0.5 mg/dL (ref 0.2–1.2)
Total Protein: 6.8 g/dL (ref 6.0–8.3)

## 2022-10-14 LAB — CBC WITH DIFFERENTIAL/PLATELET
Basophils Absolute: 0 10*3/uL (ref 0.0–0.1)
Basophils Relative: 0.5 % (ref 0.0–3.0)
Eosinophils Absolute: 0.1 10*3/uL (ref 0.0–0.7)
Eosinophils Relative: 1.2 % (ref 0.0–5.0)
HCT: 41.2 % (ref 39.0–52.0)
Hemoglobin: 14 g/dL (ref 13.0–17.0)
Lymphocytes Relative: 18.9 % (ref 12.0–46.0)
Lymphs Abs: 1.2 10*3/uL (ref 0.7–4.0)
MCHC: 34 g/dL (ref 30.0–36.0)
MCV: 95.8 fl (ref 78.0–100.0)
Monocytes Absolute: 0.7 10*3/uL (ref 0.1–1.0)
Monocytes Relative: 10.4 % (ref 3.0–12.0)
Neutro Abs: 4.5 10*3/uL (ref 1.4–7.7)
Neutrophils Relative %: 69 % (ref 43.0–77.0)
Platelets: 246 10*3/uL (ref 150.0–400.0)
RBC: 4.3 Mil/uL (ref 4.22–5.81)
RDW: 12.4 % (ref 11.5–15.5)
WBC: 6.6 10*3/uL (ref 4.0–10.5)

## 2022-10-14 LAB — VITAMIN B12: Vitamin B-12: 265 pg/mL (ref 211–911)

## 2022-10-14 LAB — TESTOSTERONE: Testosterone: 108.77 ng/dL — ABNORMAL LOW (ref 300.00–890.00)

## 2022-10-14 LAB — URINALYSIS
Bilirubin Urine: NEGATIVE
Ketones, ur: NEGATIVE
Leukocytes,Ua: NEGATIVE
Nitrite: NEGATIVE
Specific Gravity, Urine: 1.025 (ref 1.000–1.030)
Total Protein, Urine: NEGATIVE
Urine Glucose: NEGATIVE
Urobilinogen, UA: 0.2 (ref 0.0–1.0)
pH: 6 (ref 5.0–8.0)

## 2022-10-14 LAB — VITAMIN D 25 HYDROXY (VIT D DEFICIENCY, FRACTURES): VITD: 34.62 ng/mL (ref 30.00–100.00)

## 2022-10-14 LAB — LIPID PANEL
Cholesterol: 190 mg/dL (ref 0–200)
HDL: 40.1 mg/dL (ref >39.00)
NonHDL: 149.4
Total CHOL/HDL Ratio: 5
Triglycerides: 366 mg/dL — ABNORMAL HIGH (ref 0.0–149.0)
VLDL: 73.2 mg/dL — ABNORMAL HIGH (ref 0.0–40.0)

## 2022-10-14 LAB — TSH: TSH: 0.87 u[IU]/mL (ref 0.35–5.50)

## 2022-10-14 LAB — LDL CHOLESTEROL, DIRECT: Direct LDL: 118 mg/dL

## 2022-10-14 LAB — PSA: PSA: 0.46 ng/mL (ref 0.10–4.00)

## 2022-10-14 MED ORDER — IRBESARTAN 300 MG PO TABS: ORAL_TABLET | ORAL | 0 refills | Status: DC

## 2022-10-14 MED ORDER — ALPRAZOLAM 0.5 MG PO TABS: 0.5000 mg | ORAL_TABLET | Freq: Two times a day (BID) | ORAL | 5 refills | Status: DC | PRN

## 2022-10-14 MED ORDER — BUPROPION HCL ER (XL) 150 MG PO TB24: 150.0000 mg | ORAL_TABLET | Freq: Every day | ORAL | 0 refills | Status: DC

## 2022-10-14 MED ORDER — TADALAFIL 5 MG PO TABS: 5.0000 mg | ORAL_TABLET | Freq: Every day | ORAL | 3 refills | Status: AC

## 2022-10-14 MED ORDER — AMLODIPINE BESYLATE 5 MG PO TABS: 5.0000 mg | ORAL_TABLET | Freq: Every day | ORAL | 0 refills | Status: DC

## 2022-10-14 NOTE — Assessment & Plan Note (Signed)
Vit D - restart Rx 

## 2022-10-14 NOTE — Assessment & Plan Note (Signed)
On daily Cialis 

## 2022-10-14 NOTE — Progress Notes (Signed)
Subjective:  Patient ID: David Hinton, male    DOB: Mar 27, 1961  Age: 62 y.o. MRN: [id number]  CC: No chief complaint on file.   HPI NICKEY KLOEPFER presents for HTN, anxiety, GERD  Outpatient Medications Prior to Visit  Medication Sig Dispense Refill   Cholecalciferol (VITAMIN D3) 50 MCG (2000 UT) capsule Take 1 capsule (2,000 Units total) by mouth daily. 100 capsule 3   omeprazole (PRILOSEC) 20 MG capsule Take 2 capsules (40 mg total) by mouth daily. 60 capsule 11   ALPRAZolam (XANAX) 0.5 MG tablet TAKE 1 TABLET (0.5 MG TOTAL) BY MOUTH TWICE A DAY AS NEEDED FOR ANXIETY 60 tablet 0   amLODipine (NORVASC) 5 MG tablet Take 1 tablet (5 mg total) by mouth daily. Must keep 10/14/22 appt for future refills 30 tablet 0   buPROPion (WELLBUTRIN XL) 150 MG 24 hr tablet Take 1 tablet (150 mg total) by mouth daily. Must keep 10/14/22 appt for future refills 30 tablet 0   irbesartan (AVAPRO) 300 MG tablet TAKE 1 TABLET(300 MG TOTAL) BY MOUTH DAILY. Must keep 10/14/22 appt for future refills 30 tablet 0   tadalafil (CIALIS) 5 MG tablet Take 1 tablet (5 mg total) by mouth daily. 90 tablet 3   tamsulosin (FLOMAX) 0.4 MG CAPS capsule Take 1 capsule (0.4 mg total) by mouth daily. 90 capsule 3   No facility-administered medications prior to visit.    ROS: Review of Systems  Constitutional:  Positive for fatigue. Negative for appetite change and unexpected weight change.  HENT:  Negative for congestion, nosebleeds, sneezing, sore throat and trouble swallowing.   Eyes:  Negative for itching and visual disturbance.  Respiratory:  Negative for cough.   Cardiovascular:  Negative for chest pain, palpitations and leg swelling.  Gastrointestinal:  Negative for abdominal distention, blood in stool, diarrhea and nausea.  Genitourinary:  Negative for frequency and hematuria.  Musculoskeletal:  Negative for back pain, gait problem, joint swelling and neck pain.  Skin:  Negative for rash.  Neurological:   Negative for dizziness, tremors, speech difficulty and weakness.  Psychiatric/Behavioral:  Positive for sleep disturbance. Negative for agitation, dysphoric mood and suicidal ideas. The patient is nervous/anxious.     Objective:  BP 112/70 (BP Location: Left Arm, Patient Position: Sitting, Cuff Size: Normal)   Pulse 73   Temp 99.7 F (37.6 C) (Oral)   Ht  (1.753 m)   Wt 175 lb (79.4 kg)   SpO2 96%   BMI 25.84 kg/m   BP Readings from Last 3 Encounters:  10/14/22 112/70  06/18/21 140/90  12/16/20 (!) 150/90    Wt Readings from Last 3 Encounters:  10/14/22 175 lb (79.4 kg)  06/18/21 174 lb (78.9 kg)  12/16/20 165 lb 12.8 oz (75.2 kg)    Physical Exam Constitutional:      General: He is not in acute distress.    Appearance: Normal appearance. He is well-developed.     Comments: NAD  Eyes:     Conjunctiva/sclera: Conjunctivae normal.     Pupils: Pupils are equal, round, and reactive to light.  Neck:     Thyroid: No thyromegaly.     Vascular: No JVD.  Cardiovascular:     Rate and Rhythm: Normal rate and regular rhythm.     Heart sounds: Normal heart sounds. No murmur heard.    No friction rub. No gallop.  Pulmonary:     Effort: Pulmonary effort is normal. No respiratory distress.     Breath  sounds: Normal breath sounds. No wheezing or rales.  Chest:     Chest wall: No tenderness.  Abdominal:     General: Bowel sounds are normal. There is no distension.     Palpations: Abdomen is soft. There is no mass.     Tenderness: There is no abdominal tenderness. There is no guarding or rebound.  Musculoskeletal:        General: No tenderness. Normal range of motion.     Cervical back: Normal range of motion.  Lymphadenopathy:     Cervical: No cervical adenopathy.  Skin:    General: Skin is warm and dry.     Findings: No rash.  Neurological:     Mental Status: He is alert and oriented to person, place, and time.     Cranial Nerves: No cranial nerve deficit.     Motor:  No abnormal muscle tone.     Coordination: Coordination normal.     Gait: Gait normal.     Deep Tendon Reflexes: Reflexes are normal and symmetric.  Psychiatric:        Behavior: Behavior normal.        Thought Content: Thought content normal.        Judgment: Judgment normal.     Lab Results  Component Value Date   WBC 6.6 10/14/2022   HGB 14.0 10/14/2022   HCT 41.2 10/14/2022   PLT 246.0 10/14/2022   GLUCOSE 90 10/14/2022   CHOL 190 10/14/2022   TRIG 366.0 (H) 10/14/2022   HDL 40.10 10/14/2022   LDLDIRECT 118.0 10/14/2022   LDLCALC 140 (H) 07/04/2019   ALT 14 10/14/2022   AST 20 10/14/2022   NA 140 10/14/2022   K 4.7 10/14/2022   CL 105 10/14/2022   CREATININE 1.67 (H) 10/14/2022   BUN 19 10/14/2022   CO2 27 10/14/2022   TSH 0.87 10/14/2022   PSA 0.46 10/14/2022   HGBA1C 5.8 12/18/2019    DG Chest 2 View  Result Date: 11/27/2016 CLINICAL DATA:  Cough. EXAM: CHEST  2 VIEW COMPARISON:  Radiographs of August 23, 2014. FINDINGS: The heart size and mediastinal contours are within normal limits. Both lungs are clear. No pneumothorax or pleural effusion is noted. The visualized skeletal structures are unremarkable. IMPRESSION: No active cardiopulmonary disease. Electronically Signed   By: Lupita Raider, M.D.   On: 11/27/2016 13:11    Assessment & Plan:   Problem List Items Addressed This Visit     Hypogonadism in male - Primary     We discussed replacement options  Potential benefits of a long term testosterone use as well as potential risks (BPH, MI, OSA, cancer)  and complications were explained to the patient and were aknowledged. Androgel      Vitamin D deficiency    Vit D - restart Rx      Insomnia disorder    Situational Belsomra did not help - 5/17 Will try Belsomra 20 mg qhs Trazodone intolerant  Potential benefits of a long term benzodiazepines  use as well as potential risks  and complications were explained to the patient and were aknowledged.       Relevant Orders   Vitamin B12 (Completed)   Testosterone (Completed)   Fatigue   Relevant Orders   Vitamin B12 (Completed)   Testosterone (Completed)   VITAMIN D 25 Hydroxy (Vit-D Deficiency, Fractures) (Completed)   B. burgdorfi antibodies (Completed)   Comprehensive metabolic panel   Well adult exam   Relevant Orders   TSH (  Completed)   Urinalysis (Completed)   CBC with Differential/Platelet (Completed)   Lipid panel (Completed)   PSA (Completed)   Comprehensive metabolic panel (Completed)   Vitamin B12 (Completed)   Testosterone (Completed)   VITAMIN D 25 Hydroxy (Vit-D Deficiency, Fractures) (Completed)   BPH (benign prostatic hyperplasia)    On daily Cialis      HTN (hypertension)    Cont on Amlodipine New reduced GFR 43 Hydrate well.  Reduce irbesartan 150 mg daily.  Renal ultrasound Repeat c-Met, UA and urine microalbumin      Relevant Medications   amLODipine (NORVASC) 5 MG tablet   irbesartan (AVAPRO) 300 MG tablet   tadalafil (CIALIS) 5 MG tablet   Decreased GFR    New.  GFR 43 Hydrate well.  Reduce irbesartan 150 mg daily.  Renal ultrasound Repeat c-Met, UA and urine microalbumin      Relevant Orders   Comprehensive metabolic panel   Urinalysis   Microalbumin / creatinine urine ratio   US RENAL      Meds ordered this encounter  Medications   ALPRAZolam (XANAX) 0.5 MG tablet    Sig: Take 1 tablet (0.5 mg total) by mouth 2 (two) times daily as needed for anxiety.    Dispense:  60 tablet    Refill:  5    Schedule office visit   amLODipine (NORVASC) 5 MG tablet    Sig: Take 1 tablet (5 mg total) by mouth daily. Must keep 10/14/22 appt for future refills    Dispense:  30 tablet    Refill:  0   buPROPion (WELLBUTRIN XL) 150 MG 24 hr tablet    Sig: Take 1 tablet (150 mg total) by mouth daily. Must keep 10/14/22 appt for future refills    Dispense:  30 tablet    Refill:  0   irbesartan (AVAPRO) 300 MG tablet    Sig: TAKE 1 TABLET(300 MG TOTAL) BY  MOUTH DAILY. Must keep 10/14/22 appt for future refills    Dispense:  30 tablet    Refill:  0   tadalafil (CIALIS) 5 MG tablet    Sig: Take 1 tablet (5 mg total) by mouth daily.    Dispense:  90 tablet    Refill:  3      Follow-up: Return in about 4 months (around 02/13/2023) for a follow-up visit.  Sonda Primes, MD

## 2022-10-14 NOTE — Assessment & Plan Note (Signed)
Situational Belsomra did not help - 5/17 Will try Belsomra 20 mg qhs Trazodone intolerant  Potential benefits of a long term benzodiazepines  use as well as potential risks  and complications were explained to the patient and were aknowledged.

## 2022-10-14 NOTE — Assessment & Plan Note (Addendum)
Cont on Amlodipine New reduced GFR 43 Hydrate well.  Reduce irbesartan 150 mg daily.  Renal ultrasound Repeat c-Met, UA and urine microalbumin

## 2022-10-14 NOTE — Assessment & Plan Note (Signed)
We discussed replacement options  Potential benefits of a long term testosterone use as well as potential risks (BPH, MI, OSA, cancer)  and complications were explained to the patient and were aknowledged. Androgel

## 2022-10-15 LAB — B. BURGDORFI ANTIBODIES: B burgdorferi Ab IgG+IgM: <0.9 index

## 2022-10-17 DIAGNOSIS — R944 Abnormal results of kidney function studies: Secondary | ICD-10-CM | POA: Insufficient documentation

## 2022-10-17 NOTE — Assessment & Plan Note (Addendum)
New.  GFR 43 Hydrate well.  Reduce irbesartan 150 mg daily.  Renal ultrasound Repeat c-Met, UA and urine microalbumin

## 2022-10-18 ENCOUNTER — Other Ambulatory Visit: Payer: Self-pay | Admitting: Internal Medicine

## 2022-10-18 MED ORDER — IRBESARTAN 150 MG PO TABS: 150.0000 mg | ORAL_TABLET | Freq: Every day | ORAL | 11 refills | Status: AC

## 2022-10-20 ENCOUNTER — Other Ambulatory Visit: Payer: Self-pay | Admitting: Internal Medicine

## 2022-10-28 ENCOUNTER — Ambulatory Visit
Admission: RE | Admit: 2022-10-28 | Discharge: 2022-10-28 | Disposition: A | Discharge status: Home / Self Care | Payer: BC Managed Care – PPO | Source: Ambulatory Visit | Attending: Internal Medicine | Admitting: Internal Medicine

## 2022-10-28 DIAGNOSIS — R944 Abnormal results of kidney function studies: Secondary | ICD-10-CM | POA: Diagnosis not present

## 2022-10-28 DIAGNOSIS — R109 Unspecified abdominal pain: Secondary | ICD-10-CM | POA: Diagnosis not present

## 2022-11-04 NOTE — Addendum Note (Signed)
Addended by: Tresa Garter on: 11/04/2022 12:22 AM   Modules accepted: Orders

## 2022-11-18 ENCOUNTER — Ambulatory Visit
Admission: RE | Admit: 2022-11-18 | Discharge: 2022-11-18 | Disposition: A | Discharge status: Home / Self Care | Payer: BC Managed Care – PPO | Source: Ambulatory Visit | Attending: Internal Medicine | Admitting: Internal Medicine

## 2022-11-18 DIAGNOSIS — N2889 Other specified disorders of kidney and ureter: Secondary | ICD-10-CM

## 2022-11-18 DIAGNOSIS — I700 Atherosclerosis of aorta: Secondary | ICD-10-CM | POA: Diagnosis not present

## 2022-11-18 DIAGNOSIS — R109 Unspecified abdominal pain: Secondary | ICD-10-CM | POA: Diagnosis not present

## 2022-11-18 MED ORDER — IOPAMIDOL (ISOVUE-300) INJECTION 61%
100.0000 mL | Freq: Once | INTRAVENOUS | Status: AC | PRN
  Administered 2022-11-18: 100 mL via INTRAVENOUS

## 2023-04-19 ENCOUNTER — Other Ambulatory Visit: Payer: Self-pay | Admitting: Internal Medicine

## 2023-04-21 ENCOUNTER — Other Ambulatory Visit: Payer: Self-pay | Admitting: Internal Medicine

## 2023-06-30 DIAGNOSIS — D225 Melanocytic nevi of trunk: Secondary | ICD-10-CM | POA: Diagnosis not present

## 2023-06-30 DIAGNOSIS — Z85828 Personal history of other malignant neoplasm of skin: Secondary | ICD-10-CM | POA: Diagnosis not present

## 2023-06-30 DIAGNOSIS — C44712 Basal cell carcinoma of skin of right lower limb, including hip: Secondary | ICD-10-CM | POA: Diagnosis not present

## 2023-06-30 DIAGNOSIS — L720 Epidermal cyst: Secondary | ICD-10-CM | POA: Diagnosis not present

## 2023-06-30 DIAGNOSIS — L814 Other melanin hyperpigmentation: Secondary | ICD-10-CM | POA: Diagnosis not present

## 2023-07-06 ENCOUNTER — Other Ambulatory Visit: Payer: Self-pay | Admitting: Internal Medicine

## 2023-07-16 ENCOUNTER — Other Ambulatory Visit: Payer: Self-pay | Admitting: Internal Medicine

## 2023-08-20 ENCOUNTER — Encounter: Payer: Self-pay | Admitting: Internal Medicine

## 2023-08-20 ENCOUNTER — Ambulatory Visit (INDEPENDENT_AMBULATORY_CARE_PROVIDER_SITE_OTHER): Payer: BC Managed Care – PPO | Admitting: Internal Medicine

## 2023-08-20 VITALS — BP 124/82 | HR 67 | Temp 98.6°F | Ht 69.0 in | Wt 171.0 lb

## 2023-08-20 DIAGNOSIS — F341 Dysthymic disorder: Secondary | ICD-10-CM

## 2023-08-20 DIAGNOSIS — R5382 Chronic fatigue, unspecified: Secondary | ICD-10-CM

## 2023-08-20 DIAGNOSIS — M2550 Pain in unspecified joint: Secondary | ICD-10-CM

## 2023-08-20 DIAGNOSIS — E559 Vitamin D deficiency, unspecified: Secondary | ICD-10-CM | POA: Diagnosis not present

## 2023-08-20 DIAGNOSIS — G4709 Other insomnia: Secondary | ICD-10-CM | POA: Diagnosis not present

## 2023-08-20 DIAGNOSIS — R944 Abnormal results of kidney function studies: Secondary | ICD-10-CM

## 2023-08-20 DIAGNOSIS — E291 Testicular hypofunction: Secondary | ICD-10-CM | POA: Diagnosis not present

## 2023-08-20 LAB — URINALYSIS
Bilirubin Urine: NEGATIVE
Hgb urine dipstick: NEGATIVE
Ketones, ur: NEGATIVE
Leukocytes,Ua: NEGATIVE
Nitrite: NEGATIVE
Specific Gravity, Urine: >=1.03 — AB (ref 1.000–1.030)
Total Protein, Urine: NEGATIVE
Urine Glucose: NEGATIVE
Urobilinogen, UA: 0.2 (ref 0.0–1.0)
pH: 6 (ref 5.0–8.0)

## 2023-08-20 LAB — COMPREHENSIVE METABOLIC PANEL
ALT: 16 U/L (ref 0–53)
AST: 19 U/L (ref 0–37)
Albumin: 4.6 g/dL (ref 3.5–5.2)
Alkaline Phosphatase: 72 U/L (ref 39–117)
BUN: 22 mg/dL (ref 6–23)
CO2: 26 meq/L (ref 19–32)
Calcium: 9.8 mg/dL (ref 8.4–10.5)
Chloride: 102 meq/L (ref 96–112)
Creatinine, Ser: 1.23 mg/dL (ref 0.40–1.50)
GFR: 62.79 mL/min (ref >60.00)
Glucose, Bld: 93 mg/dL (ref 70–99)
Potassium: 4.6 meq/L (ref 3.5–5.1)
Sodium: 136 meq/L (ref 135–145)
Total Bilirubin: 0.6 mg/dL (ref 0.2–1.2)
Total Protein: 7.3 g/dL (ref 6.0–8.3)

## 2023-08-20 LAB — VITAMIN D 25 HYDROXY (VIT D DEFICIENCY, FRACTURES): VITD: 40.69 ng/mL (ref 30.00–100.00)

## 2023-08-20 LAB — CBC WITH DIFFERENTIAL/PLATELET
Basophils Absolute: 0 10*3/uL (ref 0.0–0.1)
Basophils Relative: 0.6 % (ref 0.0–3.0)
Eosinophils Absolute: 0.2 10*3/uL (ref 0.0–0.7)
Eosinophils Relative: 2.1 % (ref 0.0–5.0)
HCT: 43.4 % (ref 39.0–52.0)
Hemoglobin: 14.6 g/dL (ref 13.0–17.0)
Lymphocytes Relative: 20.5 % (ref 12.0–46.0)
Lymphs Abs: 1.5 10*3/uL (ref 0.7–4.0)
MCHC: 33.6 g/dL (ref 30.0–36.0)
MCV: 97.5 fl (ref 78.0–100.0)
Monocytes Absolute: 0.8 10*3/uL (ref 0.1–1.0)
Monocytes Relative: 10.3 % (ref 3.0–12.0)
Neutro Abs: 4.9 10*3/uL (ref 1.4–7.7)
Neutrophils Relative %: 66.5 % (ref 43.0–77.0)
Platelets: 243 10*3/uL (ref 150.0–400.0)
RBC: 4.45 Mil/uL (ref 4.22–5.81)
RDW: 12.4 % (ref 11.5–15.5)
WBC: 7.4 10*3/uL (ref 4.0–10.5)

## 2023-08-20 LAB — LIPID PANEL
Cholesterol: 227 mg/dL — ABNORMAL HIGH (ref 0–200)
HDL: 48.4 mg/dL (ref >39.00)
LDL Cholesterol: 146 mg/dL — ABNORMAL HIGH (ref 0–99)
NonHDL: 178.29
Total CHOL/HDL Ratio: 5
Triglycerides: 159 mg/dL — ABNORMAL HIGH (ref 0.0–149.0)
VLDL: 31.8 mg/dL (ref 0.0–40.0)

## 2023-08-20 LAB — PSA: PSA: 0.46 ng/mL (ref 0.10–4.00)

## 2023-08-20 LAB — VITAMIN B12: Vitamin B-12: 387 pg/mL (ref 211–911)

## 2023-08-20 LAB — MICROALBUMIN / CREATININE URINE RATIO
Creatinine,U: 157.3 mg/dL
Microalb Creat Ratio: 15.3 mg/g (ref 0.0–30.0)
Microalb, Ur: 2.4 mg/dL — ABNORMAL HIGH (ref 0.0–1.9)

## 2023-08-20 LAB — TSH: TSH: 2.62 u[IU]/mL (ref 0.35–5.50)

## 2023-08-20 LAB — TESTOSTERONE: Testosterone: 193.22 ng/dL — ABNORMAL LOW (ref 300.00–890.00)

## 2023-08-20 MED ORDER — XYOSTED 75 MG/0.5ML ~~LOC~~ SOAJ: 75.0000 mg | SUBCUTANEOUS | 5 refills | Status: AC

## 2023-08-20 MED ORDER — ZOLPIDEM TARTRATE 10 MG PO TABS: 10.0000 mg | ORAL_TABLET | Freq: Every evening | ORAL | 2 refills | Status: AC | PRN

## 2023-08-20 NOTE — Assessment & Plan Note (Signed)
 Worse Will try Zolpidem  Potential benefits of a long term opioids use as well as potential risks (i.e. addiction risk, apnea etc) and complications (i.e. Somnolence, constipation and others) were explained to the patient and were aknowledged.

## 2023-08-20 NOTE — Assessment & Plan Note (Addendum)
 We discussed replacement options  Potential benefits of a long term testosterone use as well as potential risks (BPH, MI, OSA, cancer)  and complications were explained to the patient and were aknowledged. Xyostat prescription provided   Potential benefits of a long term testosterone use as well as potential risks (BPH, MI, OSA, cancer)  and complications were explained to the patient and were aknowledged.

## 2023-08-20 NOTE — Assessment & Plan Note (Signed)
 1 Pt finished [employer]; planning to move to [employer].

## 2023-08-20 NOTE — Assessment & Plan Note (Signed)
Vit D - restart Rx 

## 2023-08-20 NOTE — Progress Notes (Signed)
 Subjective:  Patient ID: David Hinton, male    DOB: Nov 10, 1960  Age: 63 y.o. MRN: [id number]  CC: Annual Exam   HPI David Hinton presents for fatigue, low strength, GERD, anxiety f/u  Outpatient Medications Prior to Visit  Medication Sig Dispense Refill   ALPRAZolam (XANAX) 0.5 MG tablet TAKE 1 TABLET BY MOUTH 2 TIMES DAILY AS NEEDED FOR ANXIETY. 60 tablet 1   amLODipine (NORVASC) 5 MG tablet Take 1 tablet (5 mg total) by mouth daily. 90 tablet 3   buPROPion (WELLBUTRIN XL) 150 MG 24 hr tablet Take 1 tablet (150 mg total) by mouth daily. 90 tablet 3   Cholecalciferol (VITAMIN D3) 50 MCG (2000 UT) capsule Take 1 capsule (2,000 Units total) by mouth daily. 100 capsule 3   irbesartan (AVAPRO) 150 MG tablet Take 1 tablet (150 mg total) by mouth daily. 30 tablet 11   omeprazole (PRILOSEC) 20 MG capsule Take 2 capsules (40 mg total) by mouth daily. 60 capsule 11   tadalafil (CIALIS) 5 MG tablet Take 1 tablet (5 mg total) by mouth daily. 90 tablet 3   No facility-administered medications prior to visit.    ROS: Review of Systems  Constitutional:  Positive for fatigue. Negative for appetite change and unexpected weight change.  HENT:  Negative for congestion, nosebleeds, sneezing, sore throat and trouble swallowing.   Eyes:  Negative for itching and visual disturbance.  Respiratory:  Negative for cough.   Cardiovascular:  Negative for chest pain, palpitations and leg swelling.  Gastrointestinal:  Negative for abdominal distention, blood in stool, diarrhea and nausea.  Genitourinary:  Negative for frequency and hematuria.  Musculoskeletal:  Negative for back pain, gait problem, joint swelling and neck pain.  Skin:  Negative for rash.  Neurological:  Negative for dizziness, tremors, speech difficulty and weakness.  Hematological:  Does not bruise/bleed easily.  Psychiatric/Behavioral:  Negative for agitation, dysphoric mood, sleep disturbance and suicidal ideas. The patient is not  nervous/anxious.     Objective:  BP 124/82   Pulse 67   Temp 98.6 F (37 C) (Oral)   Ht 5\' 9"  (1.753 m)   Wt 171 lb (77.6 kg)   SpO2 94%   BMI 25.25 kg/m   BP Readings from Last 3 Encounters:  08/20/23 124/82  10/14/22 112/70  06/18/21 140/90    Wt Readings from Last 3 Encounters:  08/20/23 171 lb (77.6 kg)  10/14/22 175 lb (79.4 kg)  06/18/21 174 lb (78.9 kg)    Physical Exam Constitutional:      General: He is not in acute distress.    Appearance: Normal appearance. He is well-developed.     Comments: NAD  Eyes:     Conjunctiva/sclera: Conjunctivae normal.     Pupils: Pupils are equal, round, and reactive to light.  Neck:     Thyroid: No thyromegaly.     Vascular: No JVD.  Cardiovascular:     Rate and Rhythm: Normal rate and regular rhythm.     Heart sounds: Normal heart sounds. No murmur heard.    No friction rub. No gallop.  Pulmonary:     Effort: Pulmonary effort is normal. No respiratory distress.     Breath sounds: Normal breath sounds. No wheezing or rales.  Chest:     Chest wall: No tenderness.  Abdominal:     General: Bowel sounds are normal. There is no distension.     Palpations: Abdomen is soft. There is no mass.     Tenderness:  There is no abdominal tenderness. There is no guarding or rebound.  Musculoskeletal:        General: No tenderness. Normal range of motion.     Cervical back: Normal range of motion.  Lymphadenopathy:     Cervical: No cervical adenopathy.  Skin:    General: Skin is warm and dry.     Findings: No rash.  Neurological:     Mental Status: He is alert and oriented to person, place, and time.     Cranial Nerves: No cranial nerve deficit.     Motor: No abnormal muscle tone.     Coordination: Coordination normal.     Gait: Gait normal.     Deep Tendon Reflexes: Reflexes are normal and symmetric.  Psychiatric:        Behavior: Behavior normal.        Thought Content: Thought content normal.        Judgment: Judgment  normal.     Lab Results  Component Value Date   WBC 7.4 08/20/2023   HGB 14.6 08/20/2023   HCT 43.4 08/20/2023   PLT 243.0 08/20/2023   GLUCOSE 93 08/20/2023   CHOL 227 (H) 08/20/2023   TRIG 159.0 (H) 08/20/2023   HDL 48.40 08/20/2023   LDLDIRECT 118.0 10/14/2022   LDLCALC 146 (H) 08/20/2023   ALT 16 08/20/2023   AST 19 08/20/2023   NA 136 08/20/2023   K 4.6 08/20/2023   CL 102 08/20/2023   CREATININE 1.23 08/20/2023   BUN 22 08/20/2023   CO2 26 08/20/2023   TSH 2.62 08/20/2023   PSA 0.46 08/20/2023   HGBA1C 5.8 12/18/2019   MICROALBUR 2.4 (H) 08/20/2023    CT RENAL ABD W/WO Result Date: 11/19/2022 CLINICAL DATA:  Left flank pain. Possible left renal and abdominal wall masses on recent ultrasound. EXAM: CT ABDOMEN WITHOUT AND WITH CONTRAST TECHNIQUE: Multidetector CT imaging of the abdomen was performed following the standard protocol before and following the bolus administration of intravenous contrast. RADIATION DOSE REDUCTION: This exam was performed according to the departmental dose-optimization program which includes automated exposure control, adjustment of the mA and/or kV according to patient size and/or use of iterative reconstruction technique. CONTRAST:  ISOVUE-300 IOPAMIDOL (ISOVUE-300) INJECTION 61% COMPARISON:  Renal ultrasound on 10/28/2022 FINDINGS: Lower chest: No acute findings. Hepatobiliary: No hepatic masses identified. Gallbladder is unremarkable. No evidence of biliary ductal dilatation. Pancreas:  No mass or inflammatory changes. Spleen:  Within normal limits in size and appearance. Adrenals/Urinary Tract: Normal adrenal glands. Both kidneys are also normal in appearance. No evidence of renal mass. No evidence of nephrolithiasis or hydronephrosis. Stomach/Bowel: Small hiatal hernia.  Otherwise unremarkable. Vascular/Lymphatic: No pathologically enlarged lymph nodes identified. No acute vascular findings. Aortic atherosclerotic calcification incidentally  noted. Other: Tiny paraumbilical ventral hernia, which contains only fat. No evidence of abdominal wall mass or fluid collection. Musculoskeletal: No suspicious bone lesions identified. A subacute fracture of the left posterolateral 9th rib is seen which shows early healing. IMPRESSION: Normal appearance of both kidneys. No evidence of renal mass, nephrolithiasis, or hydronephrosis. Tiny paraumbilical ventral hernia, which contains only fat. No abdominal wall mass or fluid collections identified. Small hiatal hernia. Subacute, healing fracture of the left posterolateral 9th rib. Aortic Atherosclerosis (ICD10-I70.0). Electronically Signed   By: Danae Orleans M.D.   On: 11/19/2022 13:47    Assessment & Plan:   Problem List Items Addressed This Visit     Hypogonadism in male - Primary    We discussed replacement  options  Potential benefits of a long term testosterone use as well as potential risks (BPH, MI, OSA, cancer)  and complications were explained to the patient and were aknowledged. Xyostat prescription provided   Potential benefits of a long term testosterone use as well as potential risks (BPH, MI, OSA, cancer)  and complications were explained to the patient and were aknowledged.       Relevant Orders   TSH (Completed)   Urinalysis (Completed)   CBC with Differential/Platelet (Completed)   Lipid panel (Completed)   PSA (Completed)   Comprehensive metabolic panel (Completed)   Testosterone (Completed)   Vitamin B12 (Completed)   VITAMIN D 25 Hydroxy (Vit-D Deficiency, Fractures) (Completed)   Vitamin D deficiency   Vit D - restart Rx      Relevant Orders   VITAMIN D 25 Hydroxy (Vit-D Deficiency, Fractures) (Completed)   DEPRESSION/ANXIETY   1 Pt finished [employer]; planning to move to [employer].      Relevant Orders   TSH (Completed)   Urinalysis (Completed)   CBC with Differential/Platelet (Completed)   Lipid panel (Completed)   PSA (Completed)    Comprehensive metabolic panel (Completed)   Testosterone (Completed)   Vitamin B12 (Completed)   VITAMIN D 25 Hydroxy (Vit-D Deficiency, Fractures) (Completed)   Insomnia disorder   Worse Will try Zolpidem  Potential benefits of a long term opioids use as well as potential risks (i.e. addiction risk, apnea etc) and complications (i.e. Somnolence, constipation and others) were explained to the patient and were aknowledged.        Relevant Orders   TSH (Completed)   Urinalysis (Completed)   CBC with Differential/Platelet (Completed)   Lipid panel (Completed)   PSA (Completed)   Comprehensive metabolic panel (Completed)   Testosterone (Completed)   Vitamin B12 (Completed)   VITAMIN D 25 Hydroxy (Vit-D Deficiency, Fractures) (Completed)   Fatigue   Chronic.  Obtain labs including Lyme disease serology, Ehrlichia serology      Decreased GFR   Hydrate better Monitor GFR      Other Visit Diagnoses       Arthralgia, unspecified joint       Relevant Orders   Lyme disease, western blot (Completed)   Human Granulocytic Ehrlich-HGE (Completed)         Meds ordered this encounter  Medications   Testosterone Enanthate (XYOSTED) 75 MG/0.5ML SOAJ    Sig: Inject 75 mg into the skin once a week.    Dispense:  1.96 mL    Refill:  5   zolpidem (AMBIEN) 10 MG tablet    Sig: Take 1 tablet (10 mg total) by mouth at bedtime as needed for sleep.    Dispense:  30 tablet    Refill:  2    Do not take w/Xanax      Follow-up: Return in about 3 months (around 11/17/2023) for a follow-up visit.  Sonda Primes, MD

## 2023-08-24 ENCOUNTER — Encounter: Payer: Self-pay | Admitting: Internal Medicine

## 2023-08-25 LAB — LYME DISEASE, WESTERN BLOT
IgG P18 Ab.: ABSENT
IgG P23 Ab.: ABSENT
IgG P28 Ab.: ABSENT
IgG P30 Ab.: ABSENT
IgG P39 Ab.: ABSENT
IgG P41 Ab.: ABSENT
IgG P45 Ab.: ABSENT
IgG P58 Ab.: ABSENT
IgG P66 Ab.: ABSENT
IgG P93 Ab.: ABSENT
IgM P23 Ab.: ABSENT
IgM P39 Ab.: ABSENT
IgM P41 Ab.: ABSENT
Lyme IgG Wb: NEGATIVE
Lyme IgM Wb: NEGATIVE

## 2023-08-25 LAB — HUMAN GRANULOCYTIC EHRLICH-HGE
HGE IgG Titer: NEGATIVE
HGE IgM Titer: NEGATIVE

## 2023-08-30 ENCOUNTER — Encounter: Payer: Self-pay | Admitting: Internal Medicine

## 2023-08-30 NOTE — Assessment & Plan Note (Signed)
Hydrate better. Monitor GFR

## 2023-08-30 NOTE — Assessment & Plan Note (Signed)
 Chronic.  Obtain labs including Lyme disease serology, Ehrlichia serology

## 2023-09-07 ENCOUNTER — Telehealth: Payer: Self-pay | Admitting: Internal Medicine

## 2023-09-07 NOTE — Telephone Encounter (Unsigned)
 Copied from CRM [id number]. Topic: General - Other >> Sep 07, 2023 11:41 AM Fredrich Romans wrote: Reason for CRM: [employer] [facility] called to check and see if fax was sent over on behalf of patient ,requesting PA  for medication Xyosted? Cb#:[phone]

## 2023-09-08 NOTE — Telephone Encounter (Signed)
 Fax has been received filled out and placed on Providers desk for signature

## 2023-09-21 ENCOUNTER — Other Ambulatory Visit: Payer: Self-pay | Admitting: Internal Medicine
